# Patient Record
Sex: Female | Born: 1970
Health system: Southern US, Community
[De-identification: ages and names within clinical notes are randomized; demographics above are authoritative.]

## PROBLEM LIST (undated history)

## (undated) DIAGNOSIS — R000 Tachycardia, unspecified: Secondary | ICD-10-CM

## (undated) DIAGNOSIS — R002 Palpitations: Secondary | ICD-10-CM

## (undated) HISTORY — DX: Palpitations: R00.2

## (undated) HISTORY — PX: TONSILLECTOMY: SUR1361

## (undated) HISTORY — DX: Tachycardia, unspecified: R00.0

---

## 2017-09-22 DIAGNOSIS — Z0000 Encounter for general adult medical examination without abnormal findings: Secondary | ICD-10-CM | POA: Diagnosis not present

## 2017-09-22 DIAGNOSIS — R42 Dizziness and giddiness: Secondary | ICD-10-CM | POA: Diagnosis not present

## 2017-09-22 DIAGNOSIS — Z01419 Encounter for gynecological examination (general) (routine) without abnormal findings: Secondary | ICD-10-CM | POA: Diagnosis not present

## 2017-09-22 DIAGNOSIS — N920 Excessive and frequent menstruation with regular cycle: Secondary | ICD-10-CM | POA: Diagnosis not present

## 2017-09-24 DIAGNOSIS — Z1231 Encounter for screening mammogram for malignant neoplasm of breast: Secondary | ICD-10-CM | POA: Diagnosis not present

## 2017-10-01 DIAGNOSIS — R920 Mammographic microcalcification found on diagnostic imaging of breast: Secondary | ICD-10-CM | POA: Diagnosis not present

## 2017-10-01 DIAGNOSIS — R928 Other abnormal and inconclusive findings on diagnostic imaging of breast: Secondary | ICD-10-CM | POA: Diagnosis not present

## 2017-10-13 DIAGNOSIS — Z3202 Encounter for pregnancy test, result negative: Secondary | ICD-10-CM | POA: Diagnosis not present

## 2017-10-13 DIAGNOSIS — Z30433 Encounter for removal and reinsertion of intrauterine contraceptive device: Secondary | ICD-10-CM | POA: Diagnosis not present

## 2017-11-14 DIAGNOSIS — N938 Other specified abnormal uterine and vaginal bleeding: Secondary | ICD-10-CM | POA: Diagnosis not present

## 2017-11-14 DIAGNOSIS — Z30431 Encounter for routine checking of intrauterine contraceptive device: Secondary | ICD-10-CM | POA: Diagnosis not present

## 2018-04-06 DIAGNOSIS — R928 Other abnormal and inconclusive findings on diagnostic imaging of breast: Secondary | ICD-10-CM | POA: Diagnosis not present

## 2018-04-06 DIAGNOSIS — R921 Mammographic calcification found on diagnostic imaging of breast: Secondary | ICD-10-CM | POA: Diagnosis not present

## 2018-10-08 DIAGNOSIS — R921 Mammographic calcification found on diagnostic imaging of breast: Secondary | ICD-10-CM | POA: Diagnosis not present

## 2018-10-08 DIAGNOSIS — Z87898 Personal history of other specified conditions: Secondary | ICD-10-CM | POA: Diagnosis not present

## 2018-10-08 LAB — HM MAMMOGRAPHY

## 2018-12-01 DIAGNOSIS — M9903 Segmental and somatic dysfunction of lumbar region: Secondary | ICD-10-CM | POA: Diagnosis not present

## 2018-12-01 DIAGNOSIS — M5137 Other intervertebral disc degeneration, lumbosacral region: Secondary | ICD-10-CM | POA: Diagnosis not present

## 2018-12-01 DIAGNOSIS — M4316 Spondylolisthesis, lumbar region: Secondary | ICD-10-CM | POA: Diagnosis not present

## 2018-12-01 DIAGNOSIS — M5136 Other intervertebral disc degeneration, lumbar region: Secondary | ICD-10-CM | POA: Diagnosis not present

## 2018-12-03 DIAGNOSIS — M4316 Spondylolisthesis, lumbar region: Secondary | ICD-10-CM | POA: Diagnosis not present

## 2018-12-03 DIAGNOSIS — M9903 Segmental and somatic dysfunction of lumbar region: Secondary | ICD-10-CM | POA: Diagnosis not present

## 2018-12-03 DIAGNOSIS — M5136 Other intervertebral disc degeneration, lumbar region: Secondary | ICD-10-CM | POA: Diagnosis not present

## 2018-12-03 DIAGNOSIS — M5137 Other intervertebral disc degeneration, lumbosacral region: Secondary | ICD-10-CM | POA: Diagnosis not present

## 2018-12-10 DIAGNOSIS — M4316 Spondylolisthesis, lumbar region: Secondary | ICD-10-CM | POA: Diagnosis not present

## 2018-12-10 DIAGNOSIS — M5136 Other intervertebral disc degeneration, lumbar region: Secondary | ICD-10-CM | POA: Diagnosis not present

## 2018-12-10 DIAGNOSIS — M9903 Segmental and somatic dysfunction of lumbar region: Secondary | ICD-10-CM | POA: Diagnosis not present

## 2018-12-10 DIAGNOSIS — M5137 Other intervertebral disc degeneration, lumbosacral region: Secondary | ICD-10-CM | POA: Diagnosis not present

## 2018-12-16 DIAGNOSIS — M5137 Other intervertebral disc degeneration, lumbosacral region: Secondary | ICD-10-CM | POA: Diagnosis not present

## 2018-12-16 DIAGNOSIS — M9903 Segmental and somatic dysfunction of lumbar region: Secondary | ICD-10-CM | POA: Diagnosis not present

## 2018-12-16 DIAGNOSIS — M4316 Spondylolisthesis, lumbar region: Secondary | ICD-10-CM | POA: Diagnosis not present

## 2018-12-16 DIAGNOSIS — M5136 Other intervertebral disc degeneration, lumbar region: Secondary | ICD-10-CM | POA: Diagnosis not present

## 2018-12-22 DIAGNOSIS — M5137 Other intervertebral disc degeneration, lumbosacral region: Secondary | ICD-10-CM | POA: Diagnosis not present

## 2018-12-22 DIAGNOSIS — M9903 Segmental and somatic dysfunction of lumbar region: Secondary | ICD-10-CM | POA: Diagnosis not present

## 2018-12-22 DIAGNOSIS — M4316 Spondylolisthesis, lumbar region: Secondary | ICD-10-CM | POA: Diagnosis not present

## 2018-12-22 DIAGNOSIS — M5136 Other intervertebral disc degeneration, lumbar region: Secondary | ICD-10-CM | POA: Diagnosis not present

## 2018-12-24 DIAGNOSIS — M5137 Other intervertebral disc degeneration, lumbosacral region: Secondary | ICD-10-CM | POA: Diagnosis not present

## 2018-12-24 DIAGNOSIS — M5136 Other intervertebral disc degeneration, lumbar region: Secondary | ICD-10-CM | POA: Diagnosis not present

## 2018-12-24 DIAGNOSIS — M9903 Segmental and somatic dysfunction of lumbar region: Secondary | ICD-10-CM | POA: Diagnosis not present

## 2018-12-24 DIAGNOSIS — M4316 Spondylolisthesis, lumbar region: Secondary | ICD-10-CM | POA: Diagnosis not present

## 2018-12-29 DIAGNOSIS — M4316 Spondylolisthesis, lumbar region: Secondary | ICD-10-CM | POA: Diagnosis not present

## 2018-12-29 DIAGNOSIS — M5136 Other intervertebral disc degeneration, lumbar region: Secondary | ICD-10-CM | POA: Diagnosis not present

## 2018-12-29 DIAGNOSIS — M9903 Segmental and somatic dysfunction of lumbar region: Secondary | ICD-10-CM | POA: Diagnosis not present

## 2018-12-29 DIAGNOSIS — M5137 Other intervertebral disc degeneration, lumbosacral region: Secondary | ICD-10-CM | POA: Diagnosis not present

## 2018-12-30 DIAGNOSIS — M4316 Spondylolisthesis, lumbar region: Secondary | ICD-10-CM | POA: Diagnosis not present

## 2018-12-30 DIAGNOSIS — M5136 Other intervertebral disc degeneration, lumbar region: Secondary | ICD-10-CM | POA: Diagnosis not present

## 2018-12-30 DIAGNOSIS — M9903 Segmental and somatic dysfunction of lumbar region: Secondary | ICD-10-CM | POA: Diagnosis not present

## 2018-12-30 DIAGNOSIS — M5137 Other intervertebral disc degeneration, lumbosacral region: Secondary | ICD-10-CM | POA: Diagnosis not present

## 2019-01-01 ENCOUNTER — Telehealth: Payer: Self-pay | Admitting: General Practice

## 2019-01-01 NOTE — Telephone Encounter (Signed)
Patient calling in wanting to know if you will see her as a new patient. Her step sister is your patient, [name]. Please advise.

## 2019-01-01 NOTE — Telephone Encounter (Signed)
Ok to schedule.

## 2019-01-04 DIAGNOSIS — M5137 Other intervertebral disc degeneration, lumbosacral region: Secondary | ICD-10-CM | POA: Diagnosis not present

## 2019-01-04 DIAGNOSIS — M4316 Spondylolisthesis, lumbar region: Secondary | ICD-10-CM | POA: Diagnosis not present

## 2019-01-04 DIAGNOSIS — M9903 Segmental and somatic dysfunction of lumbar region: Secondary | ICD-10-CM | POA: Diagnosis not present

## 2019-01-04 DIAGNOSIS — M5136 Other intervertebral disc degeneration, lumbar region: Secondary | ICD-10-CM | POA: Diagnosis not present

## 2019-01-04 NOTE — Telephone Encounter (Signed)
Left patient a voicemail with information below. Let patient know to call us back to schedule an appointment in the office.

## 2019-01-06 DIAGNOSIS — M5136 Other intervertebral disc degeneration, lumbar region: Secondary | ICD-10-CM | POA: Diagnosis not present

## 2019-01-06 DIAGNOSIS — M5137 Other intervertebral disc degeneration, lumbosacral region: Secondary | ICD-10-CM | POA: Diagnosis not present

## 2019-01-06 DIAGNOSIS — M9903 Segmental and somatic dysfunction of lumbar region: Secondary | ICD-10-CM | POA: Diagnosis not present

## 2019-01-06 DIAGNOSIS — M4316 Spondylolisthesis, lumbar region: Secondary | ICD-10-CM | POA: Diagnosis not present

## 2019-01-11 DIAGNOSIS — M5137 Other intervertebral disc degeneration, lumbosacral region: Secondary | ICD-10-CM | POA: Diagnosis not present

## 2019-01-11 DIAGNOSIS — M9903 Segmental and somatic dysfunction of lumbar region: Secondary | ICD-10-CM | POA: Diagnosis not present

## 2019-01-11 DIAGNOSIS — M5136 Other intervertebral disc degeneration, lumbar region: Secondary | ICD-10-CM | POA: Diagnosis not present

## 2019-01-11 DIAGNOSIS — M4316 Spondylolisthesis, lumbar region: Secondary | ICD-10-CM | POA: Diagnosis not present

## 2019-01-19 DIAGNOSIS — M5137 Other intervertebral disc degeneration, lumbosacral region: Secondary | ICD-10-CM | POA: Diagnosis not present

## 2019-01-19 DIAGNOSIS — M5136 Other intervertebral disc degeneration, lumbar region: Secondary | ICD-10-CM | POA: Diagnosis not present

## 2019-01-19 DIAGNOSIS — M9903 Segmental and somatic dysfunction of lumbar region: Secondary | ICD-10-CM | POA: Diagnosis not present

## 2019-01-19 DIAGNOSIS — M4316 Spondylolisthesis, lumbar region: Secondary | ICD-10-CM | POA: Diagnosis not present

## 2019-01-25 DIAGNOSIS — M5136 Other intervertebral disc degeneration, lumbar region: Secondary | ICD-10-CM | POA: Diagnosis not present

## 2019-01-25 DIAGNOSIS — M4316 Spondylolisthesis, lumbar region: Secondary | ICD-10-CM | POA: Diagnosis not present

## 2019-01-25 DIAGNOSIS — M5137 Other intervertebral disc degeneration, lumbosacral region: Secondary | ICD-10-CM | POA: Diagnosis not present

## 2019-01-25 DIAGNOSIS — M9903 Segmental and somatic dysfunction of lumbar region: Secondary | ICD-10-CM | POA: Diagnosis not present

## 2019-02-01 ENCOUNTER — Encounter: Payer: Self-pay | Admitting: Family Medicine

## 2019-02-01 ENCOUNTER — Other Ambulatory Visit: Payer: Self-pay

## 2019-02-01 ENCOUNTER — Ambulatory Visit (INDEPENDENT_AMBULATORY_CARE_PROVIDER_SITE_OTHER): Payer: BC Managed Care – PPO | Admitting: Family Medicine

## 2019-02-01 VITALS — BP 121/80 | HR 73 | Ht 65.5 in | Wt 200.0 lb

## 2019-02-01 DIAGNOSIS — Z23 Encounter for immunization: Secondary | ICD-10-CM

## 2019-02-01 DIAGNOSIS — I459 Conduction disorder, unspecified: Secondary | ICD-10-CM | POA: Diagnosis not present

## 2019-02-01 DIAGNOSIS — N393 Stress incontinence (female) (male): Secondary | ICD-10-CM

## 2019-02-01 DIAGNOSIS — R002 Palpitations: Secondary | ICD-10-CM

## 2019-02-01 DIAGNOSIS — M41129 Adolescent idiopathic scoliosis, site unspecified: Secondary | ICD-10-CM

## 2019-02-01 DIAGNOSIS — E7800 Pure hypercholesterolemia, unspecified: Secondary | ICD-10-CM | POA: Diagnosis not present

## 2019-02-01 DIAGNOSIS — G8929 Other chronic pain: Secondary | ICD-10-CM

## 2019-02-01 DIAGNOSIS — M549 Dorsalgia, unspecified: Secondary | ICD-10-CM

## 2019-02-01 NOTE — Progress Notes (Signed)
New Patient Office Visit  Subjective:  Patient ID: Rachel Cisneros, female    DOB: 26-Feb-1971  Age: 48 y.o. MRN: [id number]  CC:  Chief Complaint  Patient presents with  . Establish Care  . Palpitations    HPI Rachel Cisneros presents to establish care.  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  She is the step sister of Rachel Cisneros a patient of mine.    She has been having some palpations that come on without any triggers.  MGM died age 32 sudden death, thinks had a heart issues.  Mother died 69 brain cancer.  Palpitations last a few min and then goes away.  He is actually been going on for a few years but she feels like they have become more frequent.  She says sometimes it feels like a skipped beat but sometimes she has episodes where she feels like her heart is racing she feels like they are different and do not occur at the same time.  Usually if it feels like her heart is racing it can last for up to 30 minutes.  She does sometimes get nauseated with it or feel like she is going to pass out and almost feels hot.  She has never actually passed out because of it.  She does have regular periods but has noticed that sometimes the symptoms occur about 3 to 5 days after her.  She does seem to have an occasional cough particularly in the morning usually associated with some drainage but does not bother her much during the day.  She does have a history of chronic back pain and sees a chiropractor periodically for that.  She does occasionally leak urine as she does have a history of stress incontinence.   History reviewed. No pertinent past medical history.  Past Surgical History:  Procedure Laterality Date  . TONSILLECTOMY      Family History  Problem Relation Age of Onset  . Brain cancer Mother 5  . Lung cancer Maternal Grandfather 22  . Sudden Cardiac Death Maternal Grandmother 49    Social History   Socioeconomic History  . Marital status: Married    Spouse name: Not  on file  . Number of children: Not on file  . Years of education: Not on file  . Highest education level: Not on file  Occupational History  . Not on file  Social Needs  . Financial resource strain: Not on file  . Food insecurity    Worry: Not on file    Inability: Not on file  . Transportation needs    Medical: Not on file    Non-medical: Not on file  Tobacco Use  . Smoking status: Former Smoker    Types: Cigarettes    Quit date: 04/15/2001    Years since quitting: 17.8  . Smokeless tobacco: Never Used  Substance and Sexual Activity  . Alcohol use: Yes    Alcohol/week: 2.0 standard drinks    Types: 2 Standard drinks or equivalent per week  . Drug use: Never  . Sexual activity: Yes    Partners: Male    Birth control/protection: I.U.D.  Lifestyle  . Physical activity    Days per week: Not on file    Minutes per session: Not on file  . Stress: Not on file  Relationships  . Social Herbalist on phone: Not on file    Gets together: Not on file    Attends religious  service: Not on file    Active member of club or organization: Not on file    Attends meetings of clubs or organizations: Not on file    Relationship status: Not on file  . Intimate partner violence    Fear of current or ex partner: Not on file    Emotionally abused: Not on file    Physically abused: Not on file    Forced sexual activity: Not on file  Other Topics Concern  . Not on file  Social History Narrative  . Not on file    ROS Review of Systems  Constitutional: Negative for diaphoresis, fever and unexpected weight change.  HENT: Negative for hearing loss, postnasal drip, sneezing and tinnitus.   Eyes: Negative for visual disturbance.  Respiratory: Positive for cough. Negative for wheezing.   Cardiovascular: Positive for palpitations. Negative for chest pain.  Genitourinary: Negative for vaginal bleeding and vaginal discharge.       Leaking urine  Musculoskeletal: Positive for back  pain. Negative for arthralgias.  Neurological: Negative for headaches.  Hematological: Negative for adenopathy. Does not bruise/bleed easily.    Objective:   Today's Vitals: BP 121/80   Pulse 73   Ht 5' 5.5" (1.664 m)   Wt 200 lb (90.7 kg)   LMP 01/19/2019 (Exact Date)   SpO2 100%   BMI 32.78 kg/m   Physical Exam Vitals signs reviewed.  Constitutional:      Appearance: She is well-developed.  HENT:     Head: Normocephalic and atraumatic.     Right Ear: External ear normal.     Left Ear: External ear normal.  Eyes:     Conjunctiva/sclera: Conjunctivae normal.  Neck:     Musculoskeletal: No muscular tenderness.     Vascular: No carotid bruit.  Cardiovascular:     Rate and Rhythm: Normal rate and regular rhythm.     Heart sounds: Normal heart sounds.     Comments: Radia. Pulse 2+ bilaterally.  Pulmonary:     Effort: Pulmonary effort is normal.     Breath sounds: Normal breath sounds.  Musculoskeletal:     Right lower leg: No edema.     Left lower leg: No edema.  Lymphadenopathy:     Cervical: No cervical adenopathy.  Skin:    General: Skin is warm and dry.     Coloration: Skin is not pale.  Neurological:     Mental Status: She is alert and oriented to person, place, and time.  Psychiatric:        Behavior: Behavior normal.     Assessment & Plan:   Problem List Items Addressed This Visit      Musculoskeletal and Integument   Scoliosis    No active treatment.         Other   Stress incontinence   Palpitation - Primary    EKG today shows NSR with low voltage in the lateral lead and Left ant fasc block. No prior for comparison. Schedule for 2 week heart monitor.  Check for anemia and thyroid d/o.      Relevant Orders   ECHOCARDIOGRAM COMPLETE   LONG TERM MONITOR-LIVE TELEMETRY (3-14 DAYS)   CBC (Completed)   COMPLETE METABOLIC PANEL WITH GFR (Completed)   Lipid panel (Completed)   TSH (Completed)   Ferritin (Completed)   EKG 12-Lead (Completed)    Chronic back pain   Relevant Medications   cyclobenzaprine (FLEXERIL) 10 MG tablet    Other Visit Diagnoses    Skipped  heart beats       Relevant Orders   ECHOCARDIOGRAM COMPLETE   LONG TERM MONITOR-LIVE TELEMETRY (3-14 DAYS)   CBC (Completed)   COMPLETE METABOLIC PANEL WITH GFR (Completed)   Lipid panel (Completed)   TSH (Completed)   Ferritin (Completed)   Need for immunization against influenza       Relevant Orders   Flu Vaccine QUAD 36+ mos IM (Completed)       Outpatient Encounter Medications as of 02/01/2019  Medication Sig  . cyclobenzaprine (FLEXERIL) 10 MG tablet Take 1 tablet by mouth at bedtime as needed.  . valACYclovir (VALTREX) 500 MG tablet Take 1 tablet by mouth 2 (two) times daily as needed.   No facility-administered encounter medications on file as of 02/01/2019.     Follow-up: Return in about 4 weeks (around 03/01/2019) for palpitations.   Beatrice Lecher, MD

## 2019-02-02 DIAGNOSIS — N393 Stress incontinence (female) (male): Secondary | ICD-10-CM

## 2019-02-02 DIAGNOSIS — G8929 Other chronic pain: Secondary | ICD-10-CM | POA: Insufficient documentation

## 2019-02-02 DIAGNOSIS — M549 Dorsalgia, unspecified: Secondary | ICD-10-CM

## 2019-02-02 DIAGNOSIS — R002 Palpitations: Secondary | ICD-10-CM | POA: Insufficient documentation

## 2019-02-02 DIAGNOSIS — M419 Scoliosis, unspecified: Secondary | ICD-10-CM

## 2019-02-02 HISTORY — DX: Other chronic pain: G89.29

## 2019-02-02 HISTORY — DX: Palpitations: R00.2

## 2019-02-02 HISTORY — DX: Dorsalgia, unspecified: M54.9

## 2019-02-02 HISTORY — DX: Scoliosis, unspecified: M41.9

## 2019-02-02 HISTORY — DX: Stress incontinence (female) (male): N39.3

## 2019-02-02 LAB — LIPID PANEL
Cholesterol: 204 mg/dL — ABNORMAL HIGH (ref <200)
HDL: 63 mg/dL (ref >=50)
LDL Cholesterol (Calc): 126 mg/dL (calc) — ABNORMAL HIGH
Non-HDL Cholesterol (Calc): 141 mg/dL (calc) — ABNORMAL HIGH (ref <130)
Total CHOL/HDL Ratio: 3.2 (calc) (ref <5.0)
Triglycerides: 63 mg/dL (ref <150)

## 2019-02-02 LAB — CBC
HCT: 44.5 % (ref 35.0–45.0)
Hemoglobin: 14.8 g/dL (ref 11.7–15.5)
MCH: 30.2 pg (ref 27.0–33.0)
MCHC: 33.3 g/dL (ref 32.0–36.0)
MCV: 90.8 fL (ref 80.0–100.0)
MPV: 9.7 fL (ref 7.5–12.5)
Platelets: 318 10*3/uL (ref 140–400)
RBC: 4.9 10*6/uL (ref 3.80–5.10)
RDW: 12.2 % (ref 11.0–15.0)
WBC: 4.6 10*3/uL (ref 3.8–10.8)

## 2019-02-02 LAB — COMPLETE METABOLIC PANEL WITH GFR
AG Ratio: 2 (calc) (ref 1.0–2.5)
ALT: 8 U/L (ref 6–29)
AST: 12 U/L (ref 10–35)
Albumin: 4.3 g/dL (ref 3.6–5.1)
Alkaline phosphatase (APISO): 36 U/L (ref 31–125)
BUN: 11 mg/dL (ref 7–25)
CO2: 27 mmol/L (ref 20–32)
Calcium: 9.1 mg/dL (ref 8.6–10.2)
Chloride: 105 mmol/L (ref 98–110)
Creat: 0.7 mg/dL (ref 0.50–1.10)
GFR, Est African American: 120 mL/min/{1.73_m2} (ref >=60)
GFR, Est Non African American: 104 mL/min/{1.73_m2} (ref >=60)
Globulin: 2.1 g/dL (calc) (ref 1.9–3.7)
Glucose, Bld: 93 mg/dL (ref 65–99)
Potassium: 4.3 mmol/L (ref 3.5–5.3)
Sodium: 140 mmol/L (ref 135–146)
Total Bilirubin: 0.6 mg/dL (ref 0.2–1.2)
Total Protein: 6.4 g/dL (ref 6.1–8.1)

## 2019-02-02 LAB — FERRITIN: Ferritin: 65 ng/mL (ref 16–232)

## 2019-02-02 LAB — TSH: TSH: 0.89 mIU/L

## 2019-02-08 NOTE — Assessment & Plan Note (Addendum)
EKG today shows NSR with low voltage in the lateral lead and Left ant fasc block. No prior for comparison. Schedule for 2 week heart monitor.  Check for anemia and thyroid d/o.

## 2019-02-08 NOTE — Assessment & Plan Note (Signed)
No active treatment.

## 2019-02-10 ENCOUNTER — Encounter: Payer: Self-pay | Admitting: Family Medicine

## 2019-02-10 DIAGNOSIS — I459 Conduction disorder, unspecified: Secondary | ICD-10-CM

## 2019-02-10 HISTORY — DX: Conduction disorder, unspecified: I45.9

## 2019-02-15 ENCOUNTER — Telehealth: Payer: Self-pay | Admitting: Family Medicine

## 2019-02-15 NOTE — Telephone Encounter (Signed)
Dr Marquis Buggy called today about her heat monitor and Echo order that you placed on 02/01/2019. I did not have these orders and were not aware they were even placed until patient called. I gave the Echo to [facility] for authorization and I faxed the monitor to [facility]. I also called [facility] and they are going to call the patient today to get her scheduled for her monitor.    Jenny Reichmann

## 2019-02-19 ENCOUNTER — Ambulatory Visit (INDEPENDENT_AMBULATORY_CARE_PROVIDER_SITE_OTHER): Payer: BC Managed Care – PPO

## 2019-02-19 ENCOUNTER — Other Ambulatory Visit: Payer: Self-pay

## 2019-02-19 ENCOUNTER — Encounter: Payer: Self-pay | Admitting: Family

## 2019-02-19 ENCOUNTER — Ambulatory Visit (INDEPENDENT_AMBULATORY_CARE_PROVIDER_SITE_OTHER): Payer: BC Managed Care – PPO | Admitting: Family

## 2019-02-19 ENCOUNTER — Ambulatory Visit (HOSPITAL_BASED_OUTPATIENT_CLINIC_OR_DEPARTMENT_OTHER)
Admission: RE | Admit: 2019-02-19 | Discharge: 2019-02-19 | Disposition: A | Discharge status: Home / Self Care | Payer: BC Managed Care – PPO | Source: Ambulatory Visit | Attending: Family Medicine | Admitting: Family Medicine

## 2019-02-19 VITALS — BP 118/60 | HR 67 | Resp 18 | Ht 65.5 in | Wt 198.5 lb

## 2019-02-19 DIAGNOSIS — E785 Hyperlipidemia, unspecified: Secondary | ICD-10-CM | POA: Diagnosis not present

## 2019-02-19 DIAGNOSIS — R002 Palpitations: Secondary | ICD-10-CM

## 2019-02-19 DIAGNOSIS — I459 Conduction disorder, unspecified: Secondary | ICD-10-CM | POA: Insufficient documentation

## 2019-02-19 DIAGNOSIS — Z8241 Family history of sudden cardiac death: Secondary | ICD-10-CM | POA: Diagnosis not present

## 2019-02-19 DIAGNOSIS — R000 Tachycardia, unspecified: Secondary | ICD-10-CM

## 2019-02-19 NOTE — Patient Instructions (Signed)
Medication Instructions:  No medication changes today.   *If you need a refill on your cardiac medications before your next appointment, please call your pharmacy*  Lab Work: No lab work Catering manager.  If you have labs (blood work) drawn today and your tests are completely normal, you will receive your results only by: Marland Kitchen MyChart Message (if you have MyChart) OR . A paper copy in the mail If you have any lab test that is abnormal or we need to change your treatment, we will call you to review the results.  Testing/Procedures: You had a ZIO monitor placed today. Please wear for 2 weeks then return in the mail.  You had an echocardiogram today. Expect results early next week.   Follow-Up: At [facility], you and your health needs are our priority.  As part of our continuing mission to provide you with exceptional heart care, we have created designated Provider Care Teams.  These Care Teams include your primary Cardiologist (physician) and Advanced Practice Providers (APPs -  Physician Assistants and Nurse Practitioners) who all work together to provide you with the care you need, when you need it.  Your next appointment:   5 weeks  The format for your next appointment:   In Person  Provider:   Berniece Salines, DO  Other Instructions  Medications and Palpitations 1. Avoid all over-the-counter antihistamines except Claritin/Loratadine and Zyrtec/Cetrizine. 2. Avoid all combination including cold sinus allergies flu decongestant and sleep medications 3. You can use Robitussin DM Mucinex and Mucinex DM for cough. 4. can use Tylenol aspirin ibuprofen and naproxen but no combinations such as sleep or sinus.   Palpitations Palpitations are feelings that your heartbeat is irregular or is faster than normal. It may feel like your heart is fluttering or skipping a beat. Palpitations are usually not a serious problem. They may be caused by many things, including smoking, caffeine, alcohol, stress,  and certain medicines or drugs. Most causes of palpitations are not serious. However, some palpitations can be a sign of a serious problem. You may need further tests to rule out serious medical problems. Follow these instructions at home:     Pay attention to any changes in your condition. Take these actions to help manage your symptoms: Eating and drinking  Avoid foods and drinks that may cause palpitations. These may include: ? Caffeinated coffee, tea, soft drinks, diet pills, and energy drinks. ? Chocolate. ? Alcohol. Lifestyle  Take steps to reduce your stress and anxiety. Things that can help you relax include: ? Yoga. ? Mind-body activities, such as deep breathing, meditation, or using words and images to create positive thoughts (guided imagery). ? Physical activity, such as swimming, jogging, or walking. Tell your health care provider if your palpitations increase with activity. If you have chest pain or shortness of breath with activity, do not continue the activity until you are seen by your health care provider. ? Biofeedback. This is a method that helps you learn to use your mind to control things in your body, such as your heartbeat.  Do not use drugs, including cocaine or ecstasy. Do not use marijuana.  Get plenty of rest and sleep. Keep a regular bed time. General instructions  Take over-the-counter and prescription medicines only as told by your health care provider.  Do not use any products that contain nicotine or tobacco, such as cigarettes and e-cigarettes. If you need help quitting, ask your health care provider.  Keep all follow-up visits as told by your health  care provider. This is important. These may include visits for further testing if palpitations do not go away or get worse. Contact a health care provider if you:  Continue to have a fast or irregular heartbeat after 24 hours.  Notice that your palpitations occur more often. Get help right away if you:   Have chest pain or shortness of breath.  Have a severe headache.  Feel dizzy or you faint. Summary  Palpitations are feelings that your heartbeat is irregular or is faster than normal. It may feel like your heart is fluttering or skipping a beat.  Palpitations may be caused by many things, including smoking, caffeine, alcohol, stress, certain medicines, and drugs.  Although most causes of palpitations are not serious, some causes can be a sign of a serious medical problem.  Get help right away if you faint or have chest pain, shortness of breath, a severe headache, or dizziness. This information is not intended to replace advice given to you by your health care provider. Make sure you discuss any questions you have with your health care provider. Document Released: 03/29/2000 Document Revised: 05/14/2017 Document Reviewed: 05/14/2017 Elsevier Patient Education  2020 [employer].

## 2019-02-19 NOTE — Progress Notes (Signed)
Cardiology Clinic Note   Patient Name: Rachel Cisneros Date of Encounter: 02/19/2019  Primary Care Provider:  Hali Marry, MD Primary Cardiologist:  No primary care provider on file. Electrophysiologist:  None   Patient Profile    Rachel Cisneros is a 48 y.o. female with a hx of palpitations, chronic back pain, stress incontinence. Presents today for palpitation and ZIO.   Past Medical History    Past Medical History:  Diagnosis Date  . Palpitations   . Tachycardia    Past Surgical History:  Procedure Laterality Date  . TONSILLECTOMY      Allergies  Allergies  Allergen Reactions  . Codeine Shortness Of Breath and Swelling    History of Present Illness   Rachel Cisneros is a 48 y.o. female with a hx of palpitations, chronic back pain, stress incontinence. Presents today for palpitations and placement of ZIO monitor at request of Dr. Madilyn Fireman. Recently established primary care with Dr. Madilyn Fireman, tells me she has not had a PCP previously.   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 Seen by Dr. Madilyn Fireman 02/01/19 for palpitations. Echocardiogram and ZIO were ordered at that visit. Labs were done including normal potassium, kidney function, liver function, CBC. Her lipi profile showed a total cholesterol 204, LDL 126, triglycerides 63.  She does not smoke. Tells me she drinks infrequently, typically 1-2 standard drinks. She takes no prescribed pro-arrhthymic medications. She takes no OTC proarrhthymic medications. She drinks 2 twelve ounce coffees in the morning and an occasional Coke Zero - her caffeine intake has not changed recently.  Her maternal grandmother had a sudden cardiac death. Tells me she thinks she also had some arrthymia. Her mother died of brain cancer at 39 years old.   Tells me she has had palpitations for many years. They previously were not bothersome. Tells me now she is having "weird" feelings. She notes 'waves' where she feels her heart stop and  then resume. Has felt this up into her neck. Has noted tachycardia at rest with heart rate sup to 120 bpm.   She has recently started walking and running for exercise. Endorses that she has been a bit slack in her diet recently.   An echocardiogram was ordered by Dr. Suzi Roots and completed today prior to our visit.   EKGs/Labs/Other Studies Reviewed:   The following studies were reviewed today:  EKG 02/01/19 Independently reviewed shows SR with LAFB and no acute ST/T wave changes.  Recent Labs: 02/01/2019: ALT 8; BUN 11; Creat 0.70; Hemoglobin 14.8; Platelets 318; Potassium 4.3; Sodium 140; TSH 0.89  Recent Lipid Panel    Component Value Date/Time   CHOL 204 (H) 02/01/2019 1034   TRIG 63 02/01/2019 1034   HDL 63 02/01/2019 1034   CHOLHDL 3.2 02/01/2019 1034   LDLCALC 126 (H) 02/01/2019 1034    Home Medications      Current Meds  Medication Sig  . cyclobenzaprine (FLEXERIL) 10 MG tablet Take 1 tablet by mouth at bedtime as needed.  . valACYclovir (VALTREX) 500 MG tablet Take 1 tablet by mouth 2 (two) times daily as needed.     Family History    Family History  Problem Relation Age of Onset  . Brain cancer Mother 9  . Lung cancer Maternal Grandfather 69  . Sudden Cardiac Death Maternal Grandmother 50   She indicated that her mother is deceased. She indicated that the status of her maternal grandmother is unknown. She indicated that the status of her  maternal grandfather is unknown.  Social History    Social History   Socioeconomic History  . Marital status: Married    Spouse name: Not on file  . Number of children: Not on file  . Years of education: Not on file  . Highest education level: Not on file  Occupational History  . Not on file  Social Needs  . Financial resource strain: Not on file  . Food insecurity    Worry: Not on file    Inability: Not on file  . Transportation needs    Medical: Not on file    Non-medical: Not on file  Tobacco Use  . Smoking  status: Former Smoker    Types: Cigarettes    Quit date: 04/15/2001    Years since quitting: 17.8  . Smokeless tobacco: Never Used  Substance and Sexual Activity  . Alcohol use: Yes    Alcohol/week: 2.0 standard drinks    Types: 2 Standard drinks or equivalent per week  . Drug use: Never  . Sexual activity: Yes    Partners: Male    Birth control/protection: I.U.D.  Lifestyle  . Physical activity    Days per week: Not on file    Minutes per session: Not on file  . Stress: Not on file  Relationships  . Social Herbalist on phone: Not on file    Gets together: Not on file    Attends religious service: Not on file    Active member of club or organization: Not on file    Attends meetings of clubs or organizations: Not on file    Relationship status: Not on file  . Intimate partner violence    Fear of current or ex partner: Not on file    Emotionally abused: Not on file    Physically abused: Not on file    Forced sexual activity: Not on file  Other Topics Concern  . Not on file  Social History Narrative  . Not on file     Review of Systems    ROS  Physical Exam    VS:  BP 118/60 (BP Location: Left Arm, Patient Position: Sitting, Cuff Size: Normal)   Pulse 67   Resp 18   Ht 5' 5.5" (1.664 m)   Wt 198 lb 8 oz (90 kg)   SpO2 97%   BMI 32.53 kg/m  , BMI Body mass index is 32.53 kg/m. GEN: Well nourished, well developed, in no acute distress. HEENT: Normal. Neck: Supple, no JVD, carotid bruits, or masses. Cardiac: RRR, no murmurs, rubs, or gallops. No clubbing, cyanosis, edema.  Radials/DP/PT 2+ and equal bilaterally.  Respiratory:  Respirations regular and unlabored, clear to auscultation bilaterally. GI: Soft, nontender, nondistended, BS + x 4. MS: No deformity or atrophy. Skin: Warm and dry, no rash. Neuro:  Strength and sensation are intact. Psych: Normal affect.  Assessment & Plan   1. Palpitations - Present for many years. Recently worsened with  sensation of 'wave' where her heart stops then forcefully restarts. Not associated with SOB nor diaphoresis. Labs 02/01/19 normal electrolyte, TSH.  Risk factors: does not smoke, occasional etoh use, 2 twelve oz cups coffee per day, no proarrthymic medications. Etiology PVC vs SVT. List of proarrthymic medications to avoid provided.   Recommend monitor caffeine, etoh intake. Recommend stress management as stress can trigger palpitations.   Echo done today.   14 day ZIO placed today  2. Tachycardia - HR as high as 120 when checked  at home while resting. ZIO monitor and lifestyle recommendations, as above.   3. DLD - Lipid profile 02/01/19 total cholesterol 204, HDL 63, LDl 126, triglycerides 63. Discussed lipid lowering diet including healthy fats (avocado, olive oil), lean proteins, and whole grains. Recommend continued regular cardiovascular exercise.   4. Family of sudden cardiac death - Maternal grandmother with sudden cardiac death at 46 years old. Echo done today per Dr. Morrie Sheldon order.   Disposition: in 5 week(s) with Dr. Harriet Masson.  Loel Dubonnet, NP  02/19/2019, 8:11 PM

## 2019-02-19 NOTE — Progress Notes (Signed)
  Echocardiogram 2D Echocardiogram has been performed.  Rachel Cisneros 02/19/2019, 10:53 AM

## 2019-02-26 ENCOUNTER — Ambulatory Visit: Payer: BC Managed Care – PPO | Admitting: Family Medicine

## 2019-03-13 DIAGNOSIS — R002 Palpitations: Secondary | ICD-10-CM | POA: Diagnosis not present

## 2019-03-17 ENCOUNTER — Telehealth: Payer: Self-pay | Admitting: Family

## 2019-03-17 NOTE — Telephone Encounter (Signed)
Called to review ZIO results. Left voicemail for her to call back to discuss.   ZIO monitor shows short bursts of non sustained VT that were associated with triggered events. Would like to discuss starting Toprol XL 76m daily with her. Then she can discuss how the Toprol XL is relieving symptoms at her upcoming follow up with Dr. THarriet Masson   CLoel Dubonnet NP

## 2019-03-17 NOTE — Telephone Encounter (Signed)
Reviewed results with patient via telephone.   ZIO XT with 2 short bursts of VT. Infrequent PVC/PAC <1%. Majority of her triggered episodes were SR or ST. She did trigger the short bursts of VT.  Discussed that short bursts of VT were without clear etiology as her recent echo was without significant findings.   Discussed possibility of adding Toprol XL 77m. She is reassured that she is not having frequent episodes. She requests to monitor her symptoms over the next week and talk with Dr. THarriet Massonat follow up. Agreeable with this plan.   We discussed that a beta blocker can be used to prevent episodes to prevent symptoms but is not required with her infrequent episodes at this time. Also discussed we could consider "pill in the pocket" method for if she had increased episodes of palpitations.   CLoel Dubonnet NP

## 2019-03-19 ENCOUNTER — Ambulatory Visit: Payer: BC Managed Care – PPO | Admitting: Family Medicine

## 2019-03-26 ENCOUNTER — Ambulatory Visit (INDEPENDENT_AMBULATORY_CARE_PROVIDER_SITE_OTHER): Payer: BC Managed Care – PPO | Admitting: Cardiology

## 2019-03-26 ENCOUNTER — Encounter: Payer: Self-pay | Admitting: Cardiology

## 2019-03-26 ENCOUNTER — Other Ambulatory Visit: Payer: Self-pay

## 2019-03-26 VITALS — BP 116/74 | HR 85 | Ht 65.5 in | Wt 202.0 lb

## 2019-03-26 DIAGNOSIS — E785 Hyperlipidemia, unspecified: Secondary | ICD-10-CM

## 2019-03-26 DIAGNOSIS — R079 Chest pain, unspecified: Secondary | ICD-10-CM

## 2019-03-26 DIAGNOSIS — I472 Ventricular tachycardia: Secondary | ICD-10-CM | POA: Diagnosis not present

## 2019-03-26 DIAGNOSIS — I4729 Other ventricular tachycardia: Secondary | ICD-10-CM

## 2019-03-26 MED ORDER — METOPROLOL SUCCINATE 25 MG PO CS24: 12.5000 mg | EXTENDED_RELEASE_CAPSULE | Freq: Every day | ORAL | 2 refills | Status: DC

## 2019-03-26 NOTE — Progress Notes (Signed)
Cardiology Office Note:    Date:  03/26/2019   ID:  [name], DOB 11/20/1970, MRN [id number]  PCP:  Hali Marry, MD  Cardiologist:  Berniece Salines, DO  Electrophysiologist:  None   Referring MD: Hali Marry, *   Chief Complaint  Patient presents with  . Follow-up    monitor    History of Present Illness:    Rachel Cisneros is a 48 y.o. female with a hx of chronic back pain, stress intolerance presented initially on 02/19/2019 to be evaluated for palpations.   At that time a zio monitor was placed on the patient. She is her today for a follow up visit.   She tells me that she has been experiencing mid sternum chest pain. She describes it as a squeezing sensation with no radiation. She notes that this last for seconds prior to resolution. She also admits to shortness of breath on exertion.  In addition she is still experiencing palpitations.  Past Medical History:  Diagnosis Date  . Palpitations   . Tachycardia     Past Surgical History:  Procedure Laterality Date  . TONSILLECTOMY      Current Medications: Current Meds  Medication Sig  . cyclobenzaprine (FLEXERIL) 10 MG tablet Take 1 tablet by mouth at bedtime as needed.  . valACYclovir (VALTREX) 500 MG tablet Take 1 tablet by mouth 2 (two) times daily as needed.     Allergies:   Codeine   Social History   Socioeconomic History  . Marital status: Married    Spouse name: Not on file  . Number of children: Not on file  . Years of education: Not on file  . Highest education level: Not on file  Occupational History  . Not on file  Tobacco Use  . Smoking status: Former Smoker    Types: Cigarettes    Quit date: 04/15/2001    Years since quitting: 17.9  . Smokeless tobacco: Never Used  Substance and Sexual Activity  . Alcohol use: Yes    Alcohol/week: 2.0 standard drinks    Types: 2 Standard drinks or equivalent per week  . Drug use: Never  . Sexual activity: Yes    Partners: Male   Birth control/protection: I.U.D.  Other Topics Concern  . Not on file  Social History Narrative  . Not on file   Social Determinants of Health   Financial Resource Strain:   . Difficulty of Paying Living Expenses: Not on file  Food Insecurity:   . Worried About Charity fundraiser in the Last Year: Not on file  . Ran Out of Food in the Last Year: Not on file  Transportation Needs:   . Lack of Transportation (Medical): Not on file  . Lack of Transportation (Non-Medical): Not on file  Physical Activity:   . Days of Exercise per Week: Not on file  . Minutes of Exercise per Session: Not on file  Stress:   . Feeling of Stress : Not on file  Social Connections:   . Frequency of Communication with Friends and Family: Not on file  . Frequency of Social Gatherings with Friends and Family: Not on file  . Attends Religious Services: Not on file  . Active Member of Clubs or Organizations: Not on file  . Attends Archivist Meetings: Not on file  . Marital Status: Not on file     Family History: The patient's family history includes Brain cancer (age of onset: 60) in her mother;  Lung cancer (age of onset: 89) in her maternal grandfather; Sudden Cardiac Death (age of onset: 27) in her maternal grandmother.  ROS:   Review of Systems  Constitution: Negative for decreased appetite, fever and weight gain.  HENT: Negative for congestion, ear discharge, hoarse voice and sore throat.   Eyes: Negative for discharge, redness, vision loss in right eye and visual halos.  Cardiovascular: Reports chest pain and shortness of breath. Negative for leg swelling, orthopnea.  Respiratory: Negative for cough, hemoptysis, shortness of breath and snoring.   Endocrine: Negative for heat intolerance and polyphagia.  Hematologic/Lymphatic: Negative for bleeding problem. Does not bruise/bleed easily.  Skin: Negative for flushing, nail changes, rash and suspicious lesions.  Musculoskeletal: Negative for  arthritis, joint pain, muscle cramps, myalgias, neck pain and stiffness.  Gastrointestinal: Negative for abdominal pain, bowel incontinence, diarrhea and excessive appetite.  Genitourinary: Negative for decreased libido, genital sores and incomplete emptying.  Neurological: Negative for brief paralysis, focal weakness, headaches and loss of balance.  Psychiatric/Behavioral: Negative for altered mental status, depression and suicidal ideas.  Allergic/Immunologic: Negative for HIV exposure and persistent infections.    EKGs/Labs/Other Studies Reviewed:    The following studies were reviewed today:   EKG: None today  ZIO monitor: Baseline rhythm: Sinus  Minimum heart rate: 52 BPM.  Average heart rate: 83 BPM.  Maximal heart rate 226 BPM.  Atrial arrhythmia: Infrequent PACs  Ventricular arrhythmia: 2 runs of ventricular tachycardia : 4 beats at rate of 226 symptomatic, second  6 beats at rate of 132 bpm, also ventricle ectopy noted.  Conduction abnormality: None  Symptoms: Multiple trigger events majority of those showing only sinus rhythm.  However ventricular tachycardia was also felt as fluttering.   Conclusion:  Symptomatic ventricular tachycardia, nonsustained  Recent Labs: 02/01/2019: ALT 8; BUN 11; Creat 0.70; Hemoglobin 14.8; Platelets 318; Potassium 4.3; Sodium 140; TSH 0.89  Recent Lipid Panel    Component Value Date/Time   CHOL 204 (H) 02/01/2019 1034   TRIG 63 02/01/2019 1034   HDL 63 02/01/2019 1034   CHOLHDL 3.2 02/01/2019 1034   LDLCALC 126 (H) 02/01/2019 1034    Physical Exam:    VS:  BP 116/74 (BP Location: Left Arm, Patient Position: Sitting, Cuff Size: Normal)   Pulse 85   Ht 5' 5.5" (1.664 m)   Wt 202 lb (91.6 kg)   SpO2 98%   BMI 33.10 kg/m     Wt Readings from Last 3 Encounters:  03/26/19 202 lb (91.6 kg)  02/19/19 198 lb 8 oz (90 kg)  02/01/19 200 lb (90.7 kg)     GEN: Well nourished, well developed in no acute distress HEENT:  Normal NECK: No JVD; No carotid bruits LYMPHATICS: No lymphadenopathy CARDIAC: S1S2 noted,RRR, no murmurs, rubs, gallops RESPIRATORY:  Clear to auscultation without rales, wheezing or rhonchi  ABDOMEN: Soft, non-tender, non-distended, +bowel sounds, no guarding. EXTREMITIES: No edema, No cyanosis, no clubbing MUSCULOSKELETAL:  No edema; No deformity  SKIN: Warm and dry NEUROLOGIC:  Alert and oriented x 3, non-focal PSYCHIATRIC:  Normal affect, good insight  ASSESSMENT:    1. Chest pain of uncertain etiology   2. Nonsustained ventricular tachycardia ([place])   3. Dyslipidemia    PLAN:    She still experiencing symptoms with palpitations.  In addition her monitor did show evidence of nonsustained ventricular tachycardia.  At this time I will like to start the patient on Toprol-XL 12.5 mg daily.  She is experiencing chest pain and with the nonsustained ventricular  tachycardia I want to make sure coronary disease is not a contributing factor therefore I have scheduled the patient for getting a coronary artery CTA.  She does not have any contrast dye allergy.  She is agreeable to proceed with this study.  During our discussion I was able to understand that there may be a genetic contributing factor to her NSVT as she tells me a young cousin of hers did have ICD implantation as a result of ventricular tachycardia.  We will also need to get genetic studies and an MRI may also be warranted.  Hyperlipidemia - her recent LCL is 126. Lifestyle modification. We will repeat this in 3 months. If this does not improve she will benefit from statin therapy.  The patient is in agreement with the above plan. The patient left the office in stable condition.  The patient will follow up in 3 months.   Medication Adjustments/Labs and Tests Ordered: Current medicines are reviewed at length with the patient today.  Concerns regarding medicines are outlined above.  Orders Placed This Encounter  Procedures  . CT  CORONARY MORPH W/CTA COR W/SCORE W/CA W/CM &/OR WO/CM  . CT CORONARY FRACTIONAL FLOW RESERVE DATA PREP  . CT CORONARY FRACTIONAL FLOW RESERVE FLUID ANALYSIS  . Basic Metabolic Panel (BMET)  . Magnesium   Meds ordered this encounter  Medications  . Metoprolol Succinate 25 MG CS24    Sig: Take 12.5 mg by mouth daily. 1/2 tablet    Dispense:  30 capsule    Refill:  2    Patient Instructions  Medication Instructions:  Your physician has recommended you make the following change in your medication:   START METOPROLOL SUCCINATE 12.5 MG ONCE DAILY    *If you need a refill on your cardiac medications before your next appointment, please call your pharmacy*  Lab Work: BMET Magnesium   If you have labs (blood work) drawn today and your tests are completely normal, you will receive your results only by: Rachel Cisneros (if you have MyChart) OR . A paper copy in the mail If you have any lab test that is abnormal or we need to change your treatment, we will call you to review the results.  Testing/Procedures: Your physician has requested that you have cardiac CT. Cardiac computed tomography (CT) is a painless test that uses an x-ray machine to take clear, detailed pictures of your heart. For further information please visit HugeFiesta.tn. Please follow instruction sheet as given.     Follow-Up: At [facility], you and your health needs are our priority.  As part of our continuing mission to provide you with exceptional heart care, we have created designated Provider Care Teams.  These Care Teams include your primary Cardiologist (physician) and Advanced Practice Providers (APPs -  Physician Assistants and Nurse Practitioners) who all work together to provide you with the care you need, when you need it.  Your next appointment:   3 month(s)  The format for your next appointment:   In Person  Provider:   Berniece Salines, DO  Other Instructions  Your cardiac CT will be  scheduled at one of the below locations:   [facility] [address] [place], [place] [address] [phone]  Please arrive at the [facility] main entrance of [facility] 30-45 minutes prior to test start time. Proceed to the [facility] Radiology Department (first floor) to check-in and test prep.  On the Night Before the Test: . Be sure  to Drink plenty of water. . Do not consume any caffeinated/decaffeinated beverages or chocolate 12 hours prior to your test. . Do not take any antihistamines 12 hours prior to your test.  On the Day of the Test: . Drink plenty of water. Do not drink any water within one hour of the test. . Do not eat any food 4 hours prior to the test. . You may take your regular medications prior to the test.  . Take metoprolol (Lopressor) two hours prior to test.12.5 mg . FEMALES- please wear underwire-free bra if available       After the Test: . Drink plenty of water. . After receiving IV contrast, you may experience a mild flushed feeling. This is normal. . On occasion, you may experience a mild rash up to 24 hours after the test. This is not dangerous. If this occurs, you can take Benadryl 25 mg and increase your fluid intake. . If you experience trouble breathing, this can be serious. If it is severe call 911 IMMEDIATELY. If it is mild, please call our office.  Once we have confirmed authorization from your insurance company, we will call you to set up a date and time for your test.   For non-scheduling related questions, please contact the cardiac imaging nurse navigator should you have any questions/concerns: Marchia Bond, RN Navigator Cardiac Imaging [facility] [phone] Office        Adopting a Healthy Lifestyle.  Know what a healthy weight is for you (roughly BMI <25) and aim to maintain this   Aim for 7+ servings of fruits and vegetables daily   65-80+ fluid ounces of water or unsweet  tea for healthy kidneys   Limit to max 1 drink of alcohol per day; avoid smoking/tobacco   Limit animal fats in diet for cholesterol and heart health - choose grass fed whenever available   Avoid highly processed foods, and foods high in saturated/trans fats   Aim for low stress - take time to unwind and care for your mental health   Aim for 150 min of moderate intensity exercise weekly for heart health, and weights twice weekly for bone health   Aim for 7-9 hours of sleep daily   When it comes to diets, agreement about the perfect plan isnt easy to find, even among the experts. Experts at the [employer] developed an idea known as the Healthy Eating Plate. Just imagine a plate divided into logical, healthy portions.   The emphasis is on diet quality:   Load up on vegetables and fruits - one-half of your plate: Aim for color and variety, and remember that potatoes dont count.   Go for whole grains - one-quarter of your plate: Whole wheat, barley, wheat berries, quinoa, oats, brown rice, and foods made with them. If you want pasta, go with whole wheat pasta.   Protein power - one-quarter of your plate: Fish, chicken, beans, and nuts are all healthy, versatile protein sources. Limit red meat.   The diet, however, does go beyond the plate, offering a few other suggestions.   Use healthy plant oils, such as olive, canola, soy, corn, sunflower and peanut. Check the labels, and avoid partially hydrogenated oil, which have unhealthy trans fats.   If youre thirsty, drink water. Coffee and tea are good in moderation, but skip sugary drinks and limit milk and dairy products to one or two daily servings.   The type of carbohydrate in the  diet is more important than the amount. Some sources of carbohydrates, such as vegetables, fruits, whole grains, and beans-are healthier than others.   Finally, stay active  Signed, Berniece Salines, DO  03/26/2019 9:25 PM    [facility]

## 2019-03-26 NOTE — Patient Instructions (Signed)
Medication Instructions:  Your physician has recommended you make the following change in your medication:   START METOPROLOL SUCCINATE 12.5 MG ONCE DAILY    *If you need a refill on your cardiac medications before your next appointment, please call your pharmacy*  Lab Work: BMET Magnesium   If you have labs (blood work) drawn today and your tests are completely normal, you will receive your results only by: Marland Kitchen MyChart Message (if you have MyChart) OR . A paper copy in the mail If you have any lab test that is abnormal or we need to change your treatment, we will call you to review the results.  Testing/Procedures: Your physician has requested that you have cardiac CT. Cardiac computed tomography (CT) is a painless test that uses an x-ray machine to take clear, detailed pictures of your heart. For further information please visit HugeFiesta.tn. Please follow instruction sheet as given.     Follow-Up: At [facility], you and your health needs are our priority.  As part of our continuing mission to provide you with exceptional heart care, we have created designated Provider Care Teams.  These Care Teams include your primary Cardiologist (physician) and Advanced Practice Providers (APPs -  Physician Assistants and Nurse Practitioners) who all work together to provide you with the care you need, when you need it.  Your next appointment:   3 month(s)  The format for your next appointment:   In Person  Provider:   Berniece Salines, DO  Other Instructions  Your cardiac CT will be scheduled at one of the below locations:   [facility] [address] [place], [place] [address] [phone]  Please arrive at the [facility] main entrance of [facility] 30-45 minutes prior to test start time. Proceed to the [facility] Radiology Department (first floor) to check-in and test prep.  On the Night Before the Test: . Be sure to Drink plenty of water. . Do not  consume any caffeinated/decaffeinated beverages or chocolate 12 hours prior to your test. . Do not take any antihistamines 12 hours prior to your test.  On the Day of the Test: . Drink plenty of water. Do not drink any water within one hour of the test. . Do not eat any food 4 hours prior to the test. . You may take your regular medications prior to the test.  . Take metoprolol (Lopressor) two hours prior to test.12.5 mg . FEMALES- please wear underwire-free bra if available       After the Test: . Drink plenty of water. . After receiving IV contrast, you may experience a mild flushed feeling. This is normal. . On occasion, you may experience a mild rash up to 24 hours after the test. This is not dangerous. If this occurs, you can take Benadryl 25 mg and increase your fluid intake. . If you experience trouble breathing, this can be serious. If it is severe call 911 IMMEDIATELY. If it is mild, please call our office.  Once we have confirmed authorization from your insurance company, we will call you to set up a date and time for your test.   For non-scheduling related questions, please contact the cardiac imaging nurse navigator should you have any questions/concerns: Marchia Bond, RN Navigator Cardiac Imaging [facility] [phone] Office

## 2019-03-27 LAB — MAGNESIUM: Magnesium: 2.1 mg/dL (ref 1.6–2.3)

## 2019-03-29 ENCOUNTER — Telehealth: Payer: Self-pay | Admitting: *Deleted

## 2019-03-29 NOTE — Telephone Encounter (Signed)
-----   Message from Berniece Salines, DO sent at 03/27/2019  9:42 PM EST ----- Magnesium normal.

## 2019-03-29 NOTE — Telephone Encounter (Signed)
Telephone call to patient. Left message that labs were normal and to call with any questions.

## 2019-04-02 ENCOUNTER — Encounter: Payer: Self-pay | Admitting: Family

## 2019-04-02 MED ORDER — METOPROLOL SUCCINATE ER 25 MG PO TB24: 12.5000 mg | ORAL_TABLET | Freq: Every day | ORAL | 2 refills | Status: DC

## 2019-04-21 ENCOUNTER — Encounter: Payer: Self-pay | Admitting: Family

## 2019-05-11 LAB — BASIC METABOLIC PANEL
BUN/Creatinine Ratio: 14 (ref 9–23)
BUN: 13 mg/dL (ref 6–24)
CO2: 25 mmol/L (ref 20–29)
Calcium: 9.3 mg/dL (ref 8.7–10.2)
Chloride: 104 mmol/L (ref 96–106)
Creatinine, Ser: 0.9 mg/dL (ref 0.57–1.00)
GFR calc Af Amer: 89 mL/min/{1.73_m2} (ref >59)
GFR calc non Af Amer: 77 mL/min/{1.73_m2} (ref >59)
Glucose: 61 mg/dL — ABNORMAL LOW (ref 65–99)
Potassium: 5.3 mmol/L — ABNORMAL HIGH (ref 3.5–5.2)
Sodium: 142 mmol/L (ref 134–144)

## 2019-05-11 LAB — MAGNESIUM: Magnesium: 2.2 mg/dL (ref 1.6–2.3)

## 2019-05-12 ENCOUNTER — Encounter (HOSPITAL_COMMUNITY): Payer: Self-pay

## 2019-05-12 ENCOUNTER — Telehealth (HOSPITAL_COMMUNITY): Payer: Self-pay | Admitting: Emergency Medicine

## 2019-05-12 NOTE — Telephone Encounter (Signed)
Left message on voicemail with name and callback number Shalisha Clausing RN Navigator Cardiac Imaging [facility] [phone] Office [phone] Cell  

## 2019-05-13 ENCOUNTER — Telehealth: Payer: Self-pay | Admitting: *Deleted

## 2019-05-13 ENCOUNTER — Ambulatory Visit
Admission: RE | Admit: 2019-05-13 | Discharge: 2019-05-13 | Disposition: A | Discharge status: Home / Self Care | Payer: BC Managed Care – PPO | Source: Ambulatory Visit | Attending: Cardiology | Admitting: Cardiology

## 2019-05-13 ENCOUNTER — Other Ambulatory Visit: Payer: Self-pay

## 2019-05-13 DIAGNOSIS — I4729 Other ventricular tachycardia: Secondary | ICD-10-CM

## 2019-05-13 DIAGNOSIS — E785 Hyperlipidemia, unspecified: Secondary | ICD-10-CM | POA: Diagnosis not present

## 2019-05-13 DIAGNOSIS — I472 Ventricular tachycardia: Secondary | ICD-10-CM | POA: Diagnosis not present

## 2019-05-13 DIAGNOSIS — R079 Chest pain, unspecified: Secondary | ICD-10-CM | POA: Diagnosis not present

## 2019-05-13 MED ORDER — IOHEXOL 350 MG/ML SOLN: 125.0000 mL | Freq: Once | INTRAVENOUS | Status: DC | PRN

## 2019-05-13 MED ORDER — NITROGLYCERIN 0.4 MG SL SUBL
0.8000 mg | SUBLINGUAL_TABLET | Freq: Once | SUBLINGUAL | Status: AC
  Administered 2019-05-13: 0.8 mg via SUBLINGUAL

## 2019-05-13 MED ORDER — METOPROLOL SUCCINATE ER 100 MG PO TB24
100.0000 mg | ORAL_TABLET | Freq: Every day | ORAL | Status: DC
  Administered 2019-05-13: 100 mg via ORAL

## 2019-05-13 MED ORDER — METOPROLOL TARTRATE 5 MG/5ML IV SOLN
5.0000 mg | INTRAVENOUS | Status: DC | PRN
  Administered 2019-05-13 (×2): 5 mg via INTRAVENOUS

## 2019-05-13 MED ORDER — IOPAMIDOL (ISOVUE-370) INJECTION 76%
125.0000 mL | Freq: Once | INTRAVENOUS | Status: AC | PRN
  Administered 2019-05-13: 15:00:00 125 mL via INTRAVENOUS

## 2019-05-13 MED ORDER — METOPROLOL TARTRATE 100 MG PO TABS: 100.0000 mg | ORAL_TABLET | Freq: Once | ORAL | Status: DC

## 2019-05-13 NOTE — Progress Notes (Addendum)
Patient tolerated CT without incident. Drank water and coffee after. Ambulated to to exit steady gait. Stated headache 3/10 throbbing.

## 2019-05-13 NOTE — Telephone Encounter (Signed)
-----   Message from Berniece Salines, DO sent at 05/12/2019  5:32 PM EST ----- Potassium slightly elevated. We will repeat labs in one week. Please decrease your intake of high potassium food intake

## 2019-05-13 NOTE — Telephone Encounter (Signed)
Telephone call to patient. Left message with lab results and to decrease potassium rich foods then repeat BMP in 1 week. Pt to call with any questions.

## 2019-05-14 ENCOUNTER — Encounter: Payer: Self-pay | Admitting: *Deleted

## 2019-05-25 DIAGNOSIS — E7800 Pure hypercholesterolemia, unspecified: Secondary | ICD-10-CM

## 2019-05-25 HISTORY — DX: Pure hypercholesterolemia, unspecified: E78.00

## 2019-05-29 LAB — BASIC METABOLIC PANEL
BUN/Creatinine Ratio: 16 (ref 9–23)
BUN: 12 mg/dL (ref 6–24)
CO2: 22 mmol/L (ref 20–29)
Calcium: 9.1 mg/dL (ref 8.7–10.2)
Chloride: 104 mmol/L (ref 96–106)
Creatinine, Ser: 0.74 mg/dL (ref 0.57–1.00)
GFR calc Af Amer: 112 mL/min/{1.73_m2} (ref >59)
GFR calc non Af Amer: 97 mL/min/{1.73_m2} (ref >59)
Glucose: 91 mg/dL (ref 65–99)
Potassium: 4.5 mmol/L (ref 3.5–5.2)
Sodium: 139 mmol/L (ref 134–144)

## 2019-05-31 ENCOUNTER — Telehealth: Payer: Self-pay | Admitting: *Deleted

## 2019-05-31 NOTE — Telephone Encounter (Signed)
Telephone call to patient. Left message with lab results and that they are on My Chart to view. Pt to call with any questions.

## 2019-05-31 NOTE — Telephone Encounter (Signed)
-----   Message from Berniece Salines, DO sent at 05/31/2019  9:03 AM EST ----- Normal labs, your creatinine has improved compared to 3 weeks ago.

## 2019-06-18 ENCOUNTER — Other Ambulatory Visit: Payer: Self-pay

## 2019-06-18 ENCOUNTER — Ambulatory Visit (INDEPENDENT_AMBULATORY_CARE_PROVIDER_SITE_OTHER): Payer: BC Managed Care – PPO | Admitting: Cardiology

## 2019-06-18 ENCOUNTER — Encounter: Payer: Self-pay | Admitting: Cardiology

## 2019-06-18 VITALS — BP 122/84 | HR 84 | Ht 65.5 in | Wt 204.0 lb

## 2019-06-18 DIAGNOSIS — I472 Ventricular tachycardia: Secondary | ICD-10-CM

## 2019-06-18 DIAGNOSIS — E782 Mixed hyperlipidemia: Secondary | ICD-10-CM

## 2019-06-18 DIAGNOSIS — E669 Obesity, unspecified: Secondary | ICD-10-CM | POA: Diagnosis not present

## 2019-06-18 DIAGNOSIS — I4729 Other ventricular tachycardia: Secondary | ICD-10-CM

## 2019-06-18 DIAGNOSIS — R0789 Other chest pain: Secondary | ICD-10-CM | POA: Diagnosis not present

## 2019-06-18 MED ORDER — DILTIAZEM HCL ER COATED BEADS 120 MG PO CP24: 120.0000 mg | ORAL_CAPSULE | Freq: Every day | ORAL | 1 refills | Status: DC

## 2019-06-18 NOTE — Patient Instructions (Signed)
Medication Instructions:  Your physician has recommended you make the following change in your medication:   STOP: Toprol XL(metoprolol succinate)  START Cardizem CD(diltiazem) 120 mg Take 1 tab daily  *If you need a refill on your cardiac medications before your next appointment, please call your pharmacy*   Lab Work: NOne If you have labs (blood work) drawn today and your tests are completely normal, you will receive your results only by: Marland Kitchen MyChart Message (if you have MyChart) OR . A paper copy in the mail If you have any lab test that is abnormal or we need to change your treatment, we will call you to review the results.   Testing/Procedures: nOne   Follow-Up: At [facility], you and your health needs are our priority.  As part of our continuing mission to provide you with exceptional heart care, we have created designated Provider Care Teams.  These Care Teams include your primary Cardiologist (physician) and Advanced Practice Providers (APPs -  Physician Assistants and Nurse Practitioners) who all work together to provide you with the care you need, when you need it.  We recommend signing up for the patient portal called "MyChart".  Sign up information is provided on this After Visit Summary.  MyChart is used to connect with patients for Virtual Visits (Telemedicine).  Patients are able to view lab/test results, encounter notes, upcoming appointments, etc.  Non-urgent messages can be sent to your provider as well.   To learn more about what you can do with MyChart, go to [email or web address].    Your next appointment:   3 month(s)  The format for your next appointment:   In Person  Provider:   Berniece Salines, DO   Other Instructions

## 2019-06-18 NOTE — Progress Notes (Signed)
Cardiology Office Note:    Date:  06/18/2019   ID:  [name], DOB [birth year], MRN [id number]  PCP:  Hali Marry, MD  Cardiologist:  Berniece Salines, DO  Electrophysiologist:  None   Referring MD: Hali Marry, *   Follow-up visit  History of Present Illness:    Rachel Cisneros is a 49 y.o. female with a hx of chronic back pain, initially presented to be evaluated for palpitations.  At which time ZIO monitor was placed on the patient.  She followed up on March 26, 2019 to discuss her monitor results which show evidence of symptomatic sustained ventricular tachycardia.  In addition during her visit she reported to started experience tomato midsternal chest pain.  Given her risk factors CTA coronary was recommended.  He is here today for follow-up visit to discuss her CT results.  She reports that she is still experiencing intermittent palpitations.  Along with the chest tightness.  He tells me that the chest tightness usually after her fluttering sensation.  Past Medical History:  Diagnosis Date  . Chronic back pain 02/02/2019  . Elevated LDL cholesterol level 05/25/2019  . Palpitation 02/02/2019  . Palpitations   . Scoliosis 02/02/2019  . Skipped heart beats 02/10/2019  . Stress incontinence 02/02/2019  . Tachycardia     Past Surgical History:  Procedure Laterality Date  . TONSILLECTOMY      Current Medications: Current Meds  Medication Sig  . valACYclovir (VALTREX) 500 MG tablet Take 1 tablet by mouth 2 (two) times daily as needed.  . [DISCONTINUED] metoprolol succinate (TOPROL XL) 25 MG 24 hr tablet Take 0.5 tablets (12.5 mg total) by mouth daily.     Allergies:   Codeine   Social History   Socioeconomic History  . Marital status: Married    Spouse name: Not on file  . Number of children: Not on file  . Years of education: Not on file  . Highest education level: Not on file  Occupational History  . Not on file  Tobacco Use  . Smoking status:  Former Smoker    Types: Cigarettes    Quit date: 04/15/2001    Years since quitting: 18.1  . Smokeless tobacco: Never Used  Substance and Sexual Activity  . Alcohol use: Yes    Alcohol/week: 2.0 standard drinks    Types: 2 Standard drinks or equivalent per week  . Drug use: Never  . Sexual activity: Yes    Partners: Male    Birth control/protection: I.U.D.  Other Topics Concern  . Not on file  Social History Narrative  . Not on file   Social Determinants of Health   Financial Resource Strain:   . Difficulty of Paying Living Expenses: Not on file  Food Insecurity:   . Worried About Charity fundraiser in the Last Year: Not on file  . Ran Out of Food in the Last Year: Not on file  Transportation Needs:   . Lack of Transportation (Medical): Not on file  . Lack of Transportation (Non-Medical): Not on file  Physical Activity:   . Days of Exercise per Week: Not on file  . Minutes of Exercise per Session: Not on file  Stress:   . Feeling of Stress : Not on file  Social Connections:   . Frequency of Communication with Friends and Family: Not on file  . Frequency of Social Gatherings with Friends and Family: Not on file  . Attends Religious Services: Not on file  .  Active Member of Clubs or Organizations: Not on file  . Attends Archivist Meetings: Not on file  . Marital Status: Not on file     Family History: The patient's family history includes Brain cancer (age of onset: 61) in her mother; Lung cancer (age of onset: 51) in her maternal grandfather; Sudden Cardiac Death (age of onset: 60) in her maternal grandmother.  ROS:   Review of Systems  Constitution: Negative for decreased appetite, fever and weight gain.  HENT: Negative for congestion, ear discharge, hoarse voice and sore throat.   Eyes: Negative for discharge, redness, vision loss in right eye and visual halos.  Cardiovascular: Negative for chest pain, dyspnea on exertion, leg swelling, orthopnea and  palpitations.  Respiratory: Negative for cough, hemoptysis, shortness of breath and snoring.   Endocrine: Negative for heat intolerance and polyphagia.  Hematologic/Lymphatic: Negative for bleeding problem. Does not bruise/bleed easily.  Skin: Negative for flushing, nail changes, rash and suspicious lesions.  Musculoskeletal: Negative for arthritis, joint pain, muscle cramps, myalgias, neck pain and stiffness.  Gastrointestinal: Negative for abdominal pain, bowel incontinence, diarrhea and excessive appetite.  Genitourinary: Negative for decreased libido, genital sores and incomplete emptying.  Neurological: Negative for brief paralysis, focal weakness, headaches and loss of balance.  Psychiatric/Behavioral: Negative for altered mental status, depression and suicidal ideas.  Allergic/Immunologic: Negative for HIV exposure and persistent infections.    EKGs/Labs/Other Studies Reviewed:    The following studies were reviewed today:   EKG: None today  TTE IMPRESSION: 05/13/2019 1. Coronary calcium score of 0.  2. Normal coronary origin with right dominance.  3. No evidence of CAD.  4. CAD-RADS 0. No evidence of CAD (0%). Consider non-atherosclerotic causes of chest pain.   ZIO monitor Baseline rhythm: Sinus  Minimum heart rate: 52 BPM.  Average heart rate: 83 BPM.  Maximal heart rate 226 BPM.  Atrial arrhythmia: Infrequent PACs  Ventricular arrhythmia: 2 runs of ventricular tachycardia : 4 beats at rate of 226 symptomatic, second  6 beats at rate of 132 bpm, also ventricle ectopy noted.  Conduction abnormality: None  Symptoms: Multiple trigger events majority of those showing only sinus rhythm.  However ventricular tachycardia was also felt as fluttering.  Conclusion:  Symptomatic ventricular tachycardia, nonsustained.   Recent Labs: 02/01/2019: ALT 8; Hemoglobin 14.8; Platelets 318; TSH 0.89 05/10/2019: Magnesium 2.2 05/28/2019: BUN 12; Creatinine, Ser 0.74;  Potassium 4.5; Sodium 139  Recent Lipid Panel    Component Value Date/Time   CHOL 204 (H) 02/01/2019 1034   TRIG 63 02/01/2019 1034   HDL 63 02/01/2019 1034   CHOLHDL 3.2 02/01/2019 1034   LDLCALC 126 (H) 02/01/2019 1034    Physical Exam:    VS:  BP 122/84 (BP Location: Left Arm, Patient Position: Sitting, Cuff Size: Normal)   Pulse 84   Ht 5' 5.5" (1.664 m)   Wt 204 lb (92.5 kg)   SpO2 97%   BMI 33.43 kg/m     Wt Readings from Last 3 Encounters:  06/18/19 204 lb (92.5 kg)  03/26/19 202 lb (91.6 kg)  02/19/19 198 lb 8 oz (90 kg)     GEN: Well nourished, well developed in no acute distress HEENT: Normal NECK: No JVD; No carotid bruits LYMPHATICS: No lymphadenopathy CARDIAC: S1S2 noted,RRR, no murmurs, rubs, gallops RESPIRATORY:  Clear to auscultation without rales, wheezing or rhonchi  ABDOMEN: Soft, non-tender, non-distended, +bowel sounds, no guarding. EXTREMITIES: No edema, No cyanosis, no clubbing MUSCULOSKELETAL:  No deformity  SKIN: Warm and  dry NEUROLOGIC:  Alert and oriented x 3, non-focal PSYCHIATRIC:  Normal affect, good insight  ASSESSMENT:    1. NSVT (nonsustained ventricular tachycardia) (HCC)   2. Chest tightness   3. Mixed hyperlipidemia   4. Obesity (BMI 30-39.9)    PLAN:    Given that the patient is still experiencing symptoms on metoprolol 12.5 mg daily I like to switch her medications for her especially now with my suspicion of coronary vasospasm.  We will start her on diltiazem 120 mg daily and hopefully titrate up.  I spoke with the patient in great length about her coronary CTA reporting calcium score 0 with no plaques which suggest nonatherosclerotic causes of her chest pain.  For now symptom control is what we hoped for.  Hyperlipidemia-diet modification per patient.  We will repeat lipid profile in 3 months.  Obesity-the patient understands the need to lose weight with diet and exercise. We have discussed specific strategies for  this.  The patient also asked me to update her husband who was in the waiting room and I would now and spoke with him about our discussion visit this time he offers no questions.  The patient is in agreement with the above plan. The patient left the office in stable condition.  The patient will follow up in 3 months or sooner if needed   Medication Adjustments/Labs and Tests Ordered: Current medicines are reviewed at length with the patient today.  Concerns regarding medicines are outlined above.  No orders of the defined types were placed in this encounter.  Meds ordered this encounter  Medications  . diltiazem (CARDIZEM CD) 120 MG 24 hr capsule    Sig: Take 1 capsule (120 mg total) by mouth daily.    Dispense:  90 capsule    Refill:  1    Patient Instructions  Medication Instructions:  Your physician has recommended you make the following change in your medication:   STOP: Toprol XL(metoprolol succinate)  START Cardizem CD(diltiazem) 120 mg Take 1 tab daily  *If you need a refill on your cardiac medications before your next appointment, please call your pharmacy*   Lab Work: NOne If you have labs (blood work) drawn today and your tests are completely normal, you will receive your results only by: Marland Kitchen MyChart Message (if you have MyChart) OR . A paper copy in the mail If you have any lab test that is abnormal or we need to change your treatment, we will call you to review the results.   Testing/Procedures: nOne   Follow-Up: At [facility], you and your health needs are our priority.  As part of our continuing mission to provide you with exceptional heart care, we have created designated Provider Care Teams.  These Care Teams include your primary Cardiologist (physician) and Advanced Practice Providers (APPs -  Physician Assistants and Nurse Practitioners) who all work together to provide you with the care you need, when you need it.  We recommend signing up for the  patient portal called "MyChart".  Sign up information is provided on this After Visit Summary.  MyChart is used to connect with patients for Virtual Visits (Telemedicine).  Patients are able to view lab/test results, encounter notes, upcoming appointments, etc.  Non-urgent messages can be sent to your provider as well.   To learn more about what you can do with MyChart, go to [email or web address].    Your next appointment:   3 month(s)  The format for your next appointment:   In Person  Provider:   Berniece Salines, DO   Other Instructions      Adopting a Healthy Lifestyle.  Know what a healthy weight is for you (roughly BMI <25) and aim to maintain this   Aim for 7+ servings of fruits and vegetables daily   65-80+ fluid ounces of water or unsweet tea for healthy kidneys   Limit to max 1 drink of alcohol per day; avoid smoking/tobacco   Limit animal fats in diet for cholesterol and heart health - choose grass fed whenever available   Avoid highly processed foods, and foods high in saturated/trans fats   Aim for low stress - take time to unwind and care for your mental health   Aim for 150 min of moderate intensity exercise weekly for heart health, and weights twice weekly for bone health   Aim for 7-9 hours of sleep daily   When it comes to diets, agreement about the perfect plan isnt easy to find, even among the experts. Experts at the [employer] developed an idea known as the Healthy Eating Plate. Just imagine a plate divided into logical, healthy portions.   The emphasis is on diet quality:   Load up on vegetables and fruits - one-half of your plate: Aim for color and variety, and remember that potatoes dont count.   Go for whole grains - one-quarter of your plate: Whole wheat, barley, wheat berries, quinoa, oats, brown rice, and foods made with them. If you want pasta, go with whole wheat pasta.   Protein power - one-quarter of your plate: Fish,  chicken, beans, and nuts are all healthy, versatile protein sources. Limit red meat.   The diet, however, does go beyond the plate, offering a few other suggestions.   Use healthy plant oils, such as olive, canola, soy, corn, sunflower and peanut. Check the labels, and avoid partially hydrogenated oil, which have unhealthy trans fats.   If youre thirsty, drink water. Coffee and tea are good in moderation, but skip sugary drinks and limit milk and dairy products to one or two daily servings.   The type of carbohydrate in the diet is more important than the amount. Some sources of carbohydrates, such as vegetables, fruits, whole grains, and beans-are healthier than others.   Finally, stay active  Signed, Berniece Salines, DO  06/18/2019 10:50 AM    [place]

## 2019-08-12 ENCOUNTER — Ambulatory Visit (INDEPENDENT_AMBULATORY_CARE_PROVIDER_SITE_OTHER): Payer: BC Managed Care – PPO

## 2019-08-12 ENCOUNTER — Other Ambulatory Visit: Payer: Self-pay

## 2019-08-12 ENCOUNTER — Ambulatory Visit (INDEPENDENT_AMBULATORY_CARE_PROVIDER_SITE_OTHER): Payer: BC Managed Care – PPO | Admitting: Family Medicine

## 2019-08-12 ENCOUNTER — Encounter: Payer: Self-pay | Admitting: Family Medicine

## 2019-08-12 VITALS — BP 107/64 | HR 84 | Ht 66.0 in | Wt 204.0 lb

## 2019-08-12 DIAGNOSIS — R0789 Other chest pain: Secondary | ICD-10-CM | POA: Diagnosis not present

## 2019-08-12 DIAGNOSIS — K449 Diaphragmatic hernia without obstruction or gangrene: Secondary | ICD-10-CM

## 2019-08-12 DIAGNOSIS — R079 Chest pain, unspecified: Secondary | ICD-10-CM

## 2019-08-12 HISTORY — DX: Diaphragmatic hernia without obstruction or gangrene: K44.9

## 2019-08-12 NOTE — Progress Notes (Signed)
L sided CP that began this morning. She reports that it is behind her L Breast and goes thru to her back and feels like a squeezing and it feels uncomfortable. Denies any jaw pain or pain radiating into her arm,no SOB. She has had some episodes of belching and "waves" of nausea that also began this morning.

## 2019-08-12 NOTE — Progress Notes (Signed)
Acute Office Visit  Subjective:    Patient ID: Rachel Cisneros, female    DOB: 1970-07-16, 49 y.o.   MRN: [id number]  Chief Complaint  Patient presents with  . Chest Pain    HPI Patient is in today for chest pain that started this morning.  She says is mostly on the left side most mid breast area but radiating towards her back on that left side.  She currently rates her pain a 3 out of 10.  She has had problems with palpitations and some chest tightness with her palpitations that she has been seeing Dr. Petra Kuba, her cardiologist for.  Out a month ago she was switched from metoprolol to Cardizem and says it actually has been really helpful she feels like her palpitations are under better control and she has been getting much less of the tightness and squeezing sensation in her chest.  She says even when she was experiencing that sensation previously it was brief but today her pain has lasted all morning which is unusual.  But she also reports that she has been belching and hiccuping this today.  She did eat this morning but did not notice any difference after she ate in regards to the discomfort.  She denies any increased shortness of breath.  She is has had waves of nausea.  She is never had any problems with her pancreas.  She was recently diagnosed with a hiatal hernia.  She is not tried taking any heartburn or reflux medicines and she is not currently taking any of those medicines.  Recent negative stress test and calcium score of 0  Past Medical History:  Diagnosis Date  . Chronic back pain 02/02/2019  . Elevated LDL cholesterol level 05/25/2019  . Palpitation 02/02/2019  . Palpitations   . Scoliosis 02/02/2019  . Skipped heart beats 02/10/2019  . Stress incontinence 02/02/2019  . Tachycardia     Past Surgical History:  Procedure Laterality Date  . TONSILLECTOMY      Family History  Problem Relation Age of Onset  . Brain cancer Mother 60  . Lung cancer Maternal Grandfather 57  .  Sudden Cardiac Death Maternal Grandmother 23    Social History   Socioeconomic History  . Marital status: Married    Spouse name: Not on file  . Number of children: Not on file  . Years of education: Not on file  . Highest education level: Not on file  Occupational History  . Not on file  Tobacco Use  . Smoking status: Former Smoker    Types: Cigarettes    Quit date: 04/15/2001    Years since quitting: 18.3  . Smokeless tobacco: Never Used  Substance and Sexual Activity  . Alcohol use: Yes    Alcohol/week: 2.0 standard drinks    Types: 2 Standard drinks or equivalent per week  . Drug use: Never  . Sexual activity: Yes    Partners: Male    Birth control/protection: I.U.D.  Other Topics Concern  . Not on file  Social History Narrative  . Not on file   Social Determinants of Health   Financial Resource Strain:   . Difficulty of Paying Living Expenses:   Food Insecurity:   . Worried About Charity fundraiser in the Last Year:   . Arboriculturist in the Last Year:   Transportation Needs:   . Film/video editor (Medical):   Marland Kitchen Lack of Transportation (Non-Medical):   Physical Activity:   .  Days of Exercise per Week:   . Minutes of Exercise per Session:   Stress:   . Feeling of Stress :   Social Connections:   . Frequency of Communication with Friends and Family:   . Frequency of Social Gatherings with Friends and Family:   . Attends Religious Services:   . Active Member of Clubs or Organizations:   . Attends Archivist Meetings:   Marland Kitchen Marital Status:   Intimate Partner Violence:   . Fear of Current or Ex-Partner:   . Emotionally Abused:   Marland Kitchen Physically Abused:   . Sexually Abused:     Outpatient Medications Prior to Visit  Medication Sig Dispense Refill  . diltiazem (CARDIZEM CD) 120 MG 24 hr capsule Take 1 capsule (120 mg total) by mouth daily. 90 capsule 1  . valACYclovir (VALTREX) 500 MG tablet Take 1 tablet by mouth 2 (two) times daily as needed.      No facility-administered medications prior to visit.    Allergies  Allergen Reactions  . Codeine Shortness Of Breath and Swelling    Review of Systems     Objective:    Physical Exam Vitals reviewed.  Constitutional:      Appearance: She is well-developed.  HENT:     Head: Normocephalic and atraumatic.  Eyes:     Conjunctiva/sclera: Conjunctivae normal.  Cardiovascular:     Rate and Rhythm: Normal rate and regular rhythm.     Heart sounds: Normal heart sounds.  Pulmonary:     Effort: Pulmonary effort is normal.     Breath sounds: Normal breath sounds.  Abdominal:     General: Abdomen is flat. Bowel sounds are normal.     Palpations: Abdomen is soft. There is no mass.     Tenderness: There is no abdominal tenderness. There is no guarding.  Skin:    General: Skin is warm and dry.     Coloration: Skin is not pale.  Neurological:     Mental Status: She is alert and oriented to person, place, and time.  Psychiatric:        Behavior: Behavior normal.     BP 107/64   Pulse 84   Ht 5' 6"  (1.676 m)   Wt 204 lb (92.5 kg)   SpO2 99%   BMI 32.93 kg/m  Wt Readings from Last 3 Encounters:  08/12/19 204 lb (92.5 kg)  06/18/19 204 lb (92.5 kg)  03/26/19 202 lb (91.6 kg)    Health Maintenance Due  Topic Date Due  . HIV Screening  Never done  . COVID-19 Vaccine (1) Never done  . TETANUS/TDAP  Never done  . PAP SMEAR-Modifier  Never done    There are no preventive care reminders to display for this patient.   Lab Results  Component Value Date   TSH 0.89 02/01/2019   Lab Results  Component Value Date   WBC 4.6 02/01/2019   HGB 14.8 02/01/2019   HCT 44.5 02/01/2019   MCV 90.8 02/01/2019   PLT 318 02/01/2019   Lab Results  Component Value Date   NA 139 05/28/2019   K 4.5 05/28/2019   CO2 22 05/28/2019   GLUCOSE 91 05/28/2019   BUN 12 05/28/2019   CREATININE 0.74 05/28/2019   BILITOT 0.6 02/01/2019   AST 12 02/01/2019   ALT 8 02/01/2019   PROT 6.4  02/01/2019   CALCIUM 9.1 05/28/2019   Lab Results  Component Value Date   CHOL 204 (H) 02/01/2019  Lab Results  Component Value Date   HDL 63 02/01/2019   Lab Results  Component Value Date   LDLCALC 126 (H) 02/01/2019   Lab Results  Component Value Date   TRIG 63 02/01/2019   Lab Results  Component Value Date   CHOLHDL 3.2 02/01/2019   No results found for: HGBA1C     Assessment & Plan:   Problem List Items Addressed This Visit      Other   Hiatal hernia    Other Visit Diagnoses    Atypical chest pain    -  Primary   Relevant Orders   COMPLETE METABOLIC PANEL WITH GFR   CBC   TSH   DG Chest 2 View   Troponin I   Left-sided chest pain         Atypical/left-sided chest pain-unclear etiology suspect that it could be GERD related especially with history of hiatal hernia.  Given Mylanta here in the office to see if it would improve her symptoms.  We did opt to do blood work and chest x-ray today though she is not short of breath and lungs are clear.  She is overall low risk for a pulmonary embolism no recent travel, not on birth control etc.  She was actually belching here in the office so again I think this could be GI related I think she would benefit from starting a PPI later today.  Not having significant nausea or tenderness on abdominal exam so pancreatitis etc. is less likely will check a CBC.  EKG today shows rate of 84 bpm, normal sinus rhythm.  She has low voltage QRS and unchanged from previous EKG on file from October.  Capitation's-improved on Cardizem versus metoprolol  No orders of the defined types were placed in this encounter.    Beatrice Lecher, MD

## 2019-08-13 LAB — COMPLETE METABOLIC PANEL WITH GFR
AG Ratio: 2.2 (calc) (ref 1.0–2.5)
ALT: 11 U/L (ref 6–29)
AST: 13 U/L (ref 10–35)
Albumin: 4.6 g/dL (ref 3.6–5.1)
Alkaline phosphatase (APISO): 39 U/L (ref 31–125)
BUN: 10 mg/dL (ref 7–25)
CO2: 25 mmol/L (ref 20–32)
Calcium: 9.2 mg/dL (ref 8.6–10.2)
Chloride: 106 mmol/L (ref 98–110)
Creat: 0.78 mg/dL (ref 0.50–1.10)
GFR, Est African American: 105 mL/min/{1.73_m2} (ref >=60)
GFR, Est Non African American: 91 mL/min/{1.73_m2} (ref >=60)
Globulin: 2.1 g/dL (calc) (ref 1.9–3.7)
Glucose, Bld: 98 mg/dL (ref 65–139)
Potassium: 4.2 mmol/L (ref 3.5–5.3)
Sodium: 140 mmol/L (ref 135–146)
Total Bilirubin: 0.5 mg/dL (ref 0.2–1.2)
Total Protein: 6.7 g/dL (ref 6.1–8.1)

## 2019-08-13 LAB — CBC
HCT: 44.1 % (ref 35.0–45.0)
Hemoglobin: 14.6 g/dL (ref 11.7–15.5)
MCH: 30.7 pg (ref 27.0–33.0)
MCHC: 33.1 g/dL (ref 32.0–36.0)
MCV: 92.6 fL (ref 80.0–100.0)
MPV: 9.4 fL (ref 7.5–12.5)
Platelets: 313 10*3/uL (ref 140–400)
RBC: 4.76 10*6/uL (ref 3.80–5.10)
RDW: 11.8 % (ref 11.0–15.0)
WBC: 7.4 10*3/uL (ref 3.8–10.8)

## 2019-08-13 LAB — TROPONIN I: Troponin I: <0.01 ng/mL (ref <=0.0)

## 2019-08-13 LAB — TSH: TSH: 0.62 mIU/L

## 2019-08-16 NOTE — Addendum Note (Signed)
Addended by: Narda Rutherford on: 08/16/2019 07:56 AM   Modules accepted: Orders

## 2019-10-05 ENCOUNTER — Telehealth: Payer: Self-pay

## 2019-10-05 ENCOUNTER — Encounter: Payer: Self-pay | Admitting: Family Medicine

## 2019-10-05 DIAGNOSIS — Z1239 Encounter for other screening for malignant neoplasm of breast: Secondary | ICD-10-CM

## 2019-10-05 NOTE — Telephone Encounter (Signed)
Ordered Bilateral Diagnostic Mammogram for [facility]. Last report recommended diagnostic. In Care Everywhere. I did abstract into chart.

## 2019-10-05 NOTE — Telephone Encounter (Signed)
Order signed and faxed.

## 2019-10-29 DIAGNOSIS — R921 Mammographic calcification found on diagnostic imaging of breast: Secondary | ICD-10-CM | POA: Diagnosis not present

## 2019-10-29 DIAGNOSIS — Z09 Encounter for follow-up examination after completed treatment for conditions other than malignant neoplasm: Secondary | ICD-10-CM | POA: Diagnosis not present

## 2019-10-29 LAB — HM MAMMOGRAPHY

## 2019-11-03 ENCOUNTER — Encounter: Payer: Self-pay | Admitting: *Deleted

## 2019-12-01 ENCOUNTER — Ambulatory Visit: Payer: BC Managed Care – PPO | Admitting: Cardiology

## 2019-12-04 DIAGNOSIS — Z20822 Contact with and (suspected) exposure to covid-19: Secondary | ICD-10-CM | POA: Diagnosis not present

## 2019-12-09 DIAGNOSIS — U071 COVID-19: Secondary | ICD-10-CM | POA: Diagnosis not present

## 2019-12-22 ENCOUNTER — Telehealth: Payer: Self-pay | Admitting: Cardiology

## 2019-12-22 MED ORDER — DILTIAZEM HCL ER COATED BEADS 120 MG PO CP24: 120.0000 mg | ORAL_CAPSULE | Freq: Every day | ORAL | 1 refills | Status: DC

## 2019-12-22 NOTE — Telephone Encounter (Signed)
Refill sent in per request.  

## 2019-12-22 NOTE — Telephone Encounter (Signed)
*  STAT* If patient is at the pharmacy, call can be transferred to refill team.   1. Which medications need to be refilled? (please list name of each medication and dose if known) diltiazem (CARDIZEM CD) 120 MG 24 hr capsule(Expired)  2. Which pharmacy/location (including street and city if local pharmacy) is medication to be sent to? [id number] - [place], [place] - [address] AT [facility] [address]  3. Do they need a 30 day or 90 day supply? [place]

## 2019-12-29 ENCOUNTER — Telehealth: Payer: Self-pay | Admitting: Family Medicine

## 2019-12-29 NOTE — Telephone Encounter (Signed)
Is call patient and remind her that she is due for a Pap smear at least per our records.  If she has an OB/GYN that she already sees then please let us know and we can call to get a copy of that report.

## 2019-12-29 NOTE — Telephone Encounter (Signed)
LVM asking that she rtn call about pap.

## 2019-12-31 ENCOUNTER — Other Ambulatory Visit: Payer: Self-pay

## 2019-12-31 DIAGNOSIS — R002 Palpitations: Secondary | ICD-10-CM | POA: Insufficient documentation

## 2019-12-31 DIAGNOSIS — R000 Tachycardia, unspecified: Secondary | ICD-10-CM | POA: Insufficient documentation

## 2019-12-31 NOTE — Telephone Encounter (Signed)
Pt returned a call back to the clinic regarding her last pap smear. Per pt, due to Covid pandemic she is overdue for a pap smear/ IUD removal. Pt will contact her OB/GYN to schedule an appointment. She is aware that if she is unable to get an appointment with her OB/GYN, she will call the clinic for scheduling. Pt is aware that IUD removal/placement/PAP can be done in the office with Dr. Sheppard Coil if needed. No other inquiries during call.

## 2020-01-03 ENCOUNTER — Ambulatory Visit (INDEPENDENT_AMBULATORY_CARE_PROVIDER_SITE_OTHER): Payer: BC Managed Care – PPO | Admitting: Cardiology

## 2020-01-03 ENCOUNTER — Encounter: Payer: Self-pay | Admitting: Cardiology

## 2020-01-03 ENCOUNTER — Other Ambulatory Visit: Payer: Self-pay

## 2020-01-03 VITALS — BP 120/88 | HR 76 | Ht 66.0 in | Wt 200.0 lb

## 2020-01-03 DIAGNOSIS — I493 Ventricular premature depolarization: Secondary | ICD-10-CM

## 2020-01-03 DIAGNOSIS — I472 Ventricular tachycardia: Secondary | ICD-10-CM

## 2020-01-03 DIAGNOSIS — I4729 Other ventricular tachycardia: Secondary | ICD-10-CM

## 2020-01-03 MED ORDER — DILTIAZEM HCL ER COATED BEADS 180 MG PO CP24: 180.0000 mg | ORAL_CAPSULE | Freq: Every day | ORAL | 2 refills | Status: DC

## 2020-01-03 NOTE — Patient Instructions (Signed)
Medication Instructions:  Your physician has recommended you make the following change in your medication:   Increase your Cardizem to 180 mg daily.  *If you need a refill on your cardiac medications before your next appointment, please call your pharmacy*   Lab Work: None ordered If you have labs (blood work) drawn today and your tests are completely normal, you will receive your results only by: Marland Kitchen MyChart Message (if you have MyChart) OR . A paper copy in the mail If you have any lab test that is abnormal or we need to change your treatment, we will call you to review the results.   Testing/Procedures: None ordered   Follow-Up: At [facility], you and your health needs are our priority.  As part of our continuing mission to provide you with exceptional heart care, we have created designated Provider Care Teams.  These Care Teams include your primary Cardiologist (physician) and Advanced Practice Providers (APPs -  Physician Assistants and Nurse Practitioners) who all work together to provide you with the care you need, when you need it.  We recommend signing up for the patient portal called "MyChart".  Sign up information is provided on this After Visit Summary.  MyChart is used to connect with patients for Virtual Visits (Telemedicine).  Patients are able to view lab/test results, encounter notes, upcoming appointments, etc.  Non-urgent messages can be sent to your provider as well.   To learn more about what you can do with MyChart, go to [email or web address].    Your next appointment:   3 month(s)  The format for your next appointment:   In Person  Provider:   Berniece Salines, DO   Other Instructions NA

## 2020-01-03 NOTE — Progress Notes (Signed)
Cardiology Office Note:    Date:  01/03/2020   ID:  Rachel Cisneros, DOB 06/07/1970, MRN [id number]  PCP:  Hali Marry, MD  Cardiologist:  Berniece Salines, DO  Electrophysiologist:  None   Referring MD: Hali Marry, *   Chief Complaint  Patient presents with  . Follow-up   History of Present Illness:    Rachel Cisneros is a 49 y.o. female with a hx of palpitations with 0 monitor showing evidence of PVC and nonsustained ventricular tachycardia comes today for follow-up visit.  She tells me that her Cardizem 120 mg was helping but recently this has not really helped her symptoms as she is now feeling increasing fluttering.  She denies any shortness of breath but tells me she sometimes have chest tightness associated with this.  No lightheadedness or dizziness.  Past Medical History:  Diagnosis Date  . Chronic back pain 02/02/2019  . Elevated LDL cholesterol level 05/25/2019  . Hiatal hernia 08/12/2019  . Palpitation 02/02/2019  . Palpitations   . Scoliosis 02/02/2019  . Skipped heart beats 02/10/2019  . Stress incontinence 02/02/2019  . Tachycardia     Past Surgical History:  Procedure Laterality Date  . TONSILLECTOMY      Current Medications: Current Meds  Medication Sig  . diltiazem (CARDIZEM CD) 180 MG 24 hr capsule Take 1 capsule (180 mg total) by mouth daily.  . valACYclovir (VALTREX) 500 MG tablet Take 1 tablet by mouth 2 (two) times daily as needed.  . [DISCONTINUED] diltiazem (CARDIZEM CD) 120 MG 24 hr capsule Take 1 capsule (120 mg total) by mouth daily.     Allergies:   Codeine   Social History   Socioeconomic History  . Marital status: Married    Spouse name: Not on file  . Number of children: Not on file  . Years of education: Not on file  . Highest education level: Not on file  Occupational History  . Not on file  Tobacco Use  . Smoking status: Former Smoker    Types: Cigarettes    Quit date: 04/15/2001    Years since quitting: 18.7    . Smokeless tobacco: Never Used  Vaping Use  . Vaping Use: Never used  Substance and Sexual Activity  . Alcohol use: Yes    Alcohol/week: 2.0 standard drinks    Types: 2 Standard drinks or equivalent per week  . Drug use: Never  . Sexual activity: Yes    Partners: Male    Birth control/protection: I.U.D.  Other Topics Concern  . Not on file  Social History Narrative  . Not on file   Social Determinants of Health   Financial Resource Strain:   . Difficulty of Paying Living Expenses: Not on file  Food Insecurity:   . Worried About Charity fundraiser in the Last Year: Not on file  . Ran Out of Food in the Last Year: Not on file  Transportation Needs:   . Lack of Transportation (Medical): Not on file  . Lack of Transportation (Non-Medical): Not on file  Physical Activity:   . Days of Exercise per Week: Not on file  . Minutes of Exercise per Session: Not on file  Stress:   . Feeling of Stress : Not on file  Social Connections:   . Frequency of Communication with Friends and Family: Not on file  . Frequency of Social Gatherings with Friends and Family: Not on file  . Attends Religious Services: Not on file  .  Active Member of Clubs or Organizations: Not on file  . Attends Archivist Meetings: Not on file  . Marital Status: Not on file     Family History: The patient's family history includes Brain cancer (age of onset: 60) in her mother; Lung cancer (age of onset: 68) in her maternal grandfather; Sudden Cardiac Death (age of onset: 31) in her maternal grandmother.  ROS:   Review of Systems  Constitution: Negative for decreased appetite, fever and weight gain.  HENT: Negative for congestion, ear discharge, hoarse voice and sore throat.   Eyes: Negative for discharge, redness, vision loss in right eye and visual halos.  Cardiovascular: Negative for chest pain, dyspnea on exertion, leg swelling, orthopnea and palpitations.  Respiratory: Negative for cough,  hemoptysis, shortness of breath and snoring.   Endocrine: Negative for heat intolerance and polyphagia.  Hematologic/Lymphatic: Negative for bleeding problem. Does not bruise/bleed easily.  Skin: Negative for flushing, nail changes, rash and suspicious lesions.  Musculoskeletal: Negative for arthritis, joint pain, muscle cramps, myalgias, neck pain and stiffness.  Gastrointestinal: Negative for abdominal pain, bowel incontinence, diarrhea and excessive appetite.  Genitourinary: Negative for decreased libido, genital sores and incomplete emptying.  Neurological: Negative for brief paralysis, focal weakness, headaches and loss of balance.  Psychiatric/Behavioral: Negative for altered mental status, depression and suicidal ideas.  Allergic/Immunologic: Negative for HIV exposure and persistent infections.    EKGs/Labs/Other Studies Reviewed:    The following studies were reviewed today:   EKG:  None today  Zio Monitor  Baseline rhythm: Sinus  Minimum heart rate: 52 BPM.  Average heart rate: 83 BPM.  Maximal heart rate 226 BPM.  Atrial arrhythmia: Infrequent PACs  Ventricular arrhythmia: 2 runs of ventricular tachycardia : 4 beats at rate of 226 symptomatic, second  6 beats at rate of 132 bpm, also ventricle ectopy noted.  Conduction abnormality: None  Symptoms: Multiple trigger events majority of those showing only sinus rhythm.  However ventricular tachycardia was also felt as fluttering.   Recent Labs: 05/10/2019: Magnesium 2.2 08/12/2019: ALT 11; BUN 10; Creat 0.78; Hemoglobin 14.6; Platelets 313; Potassium 4.2; Sodium 140; TSH 0.62  Recent Lipid Panel    Component Value Date/Time   CHOL 204 (H) 02/01/2019 1034   TRIG 63 02/01/2019 1034   HDL 63 02/01/2019 1034   CHOLHDL 3.2 02/01/2019 1034   LDLCALC 126 (H) 02/01/2019 1034    Physical Exam:    VS:  BP 120/88 (BP Location: Left Arm, Patient Position: Sitting, Cuff Size: Normal)   Pulse 76   Ht 5' 6"  (1.676 m)    Wt 200 lb (90.7 kg)   SpO2 96%   BMI 32.28 kg/m     Wt Readings from Last 3 Encounters:  01/03/20 200 lb (90.7 kg)  08/12/19 204 lb (92.5 kg)  06/18/19 204 lb (92.5 kg)     GEN: Well nourished, well developed in no acute distress HEENT: Normal NECK: No JVD; No carotid bruits LYMPHATICS: No lymphadenopathy CARDIAC: S1S2 noted,RRR, no murmurs, rubs, gallops RESPIRATORY:  Clear to auscultation without rales, wheezing or rhonchi  ABDOMEN: Soft, non-tender, non-distended, +bowel sounds, no guarding. EXTREMITIES: No edema, No cyanosis, no clubbing MUSCULOSKELETAL:  No deformity  SKIN: Warm and dry NEUROLOGIC:  Alert and oriented x 3, non-focal PSYCHIATRIC:  Normal affect, good insight  ASSESSMENT:    1. NSVT (nonsustained ventricular tachycardia) ([place])   2. PVC (premature ventricular contraction)    PLAN:     She is currently on Cardizem 120 mg which  initially helps with her symptoms but is now expressing more palpitations.  Like to increase her Cardizem to 180 mg daily.  In terms of her chest tightness which is atypical this is not atherosclerotic in nature as her coronary CTA recently which was done showed 0 calcium score with no evidence of coronary artery disease.  This is related to her palpitations which hopefully once we can control her symptoms this will also resolve.  The patient is in agreement with the above plan. The patient left the office in stable condition.  The patient will follow up in 3 months or sooner if needed.    Medication Adjustments/Labs and Tests Ordered: Current medicines are reviewed at length with the patient today.  Concerns regarding medicines are outlined above.  No orders of the defined types were placed in this encounter.  Meds ordered this encounter  Medications  . diltiazem (CARDIZEM CD) 180 MG 24 hr capsule    Sig: Take 1 capsule (180 mg total) by mouth daily.    Dispense:  90 capsule    Refill:  2    Patient Instructions    Medication Instructions:  Your physician has recommended you make the following change in your medication:   Increase your Cardizem to 180 mg daily.  *If you need a refill on your cardiac medications before your next appointment, please call your pharmacy*   Lab Work: None ordered If you have labs (blood work) drawn today and your tests are completely normal, you will receive your results only by: Marland Kitchen MyChart Message (if you have MyChart) OR . A paper copy in the mail If you have any lab test that is abnormal or we need to change your treatment, we will call you to review the results.   Testing/Procedures: None ordered   Follow-Up: At [facility], you and your health needs are our priority.  As part of our continuing mission to provide you with exceptional heart care, we have created designated Provider Care Teams.  These Care Teams include your primary Cardiologist (physician) and Advanced Practice Providers (APPs -  Physician Assistants and Nurse Practitioners) who all work together to provide you with the care you need, when you need it.  We recommend signing up for the patient portal called "MyChart".  Sign up information is provided on this After Visit Summary.  MyChart is used to connect with patients for Virtual Visits (Telemedicine).  Patients are able to view lab/test results, encounter notes, upcoming appointments, etc.  Non-urgent messages can be sent to your provider as well.   To learn more about what you can do with MyChart, go to [email or web address].    Your next appointment:   3 month(s)  The format for your next appointment:   In Person  Provider:   Berniece Salines, DO   Other Instructions NA     Adopting a Healthy Lifestyle.  Know what a healthy weight is for you (roughly BMI <25) and aim to maintain this   Aim for 7+ servings of fruits and vegetables daily   65-80+ fluid ounces of water or unsweet tea for healthy kidneys   Limit to max 1 drink of  alcohol per day; avoid smoking/tobacco   Limit animal fats in diet for cholesterol and heart health - choose grass fed whenever available   Avoid highly processed foods, and foods high in saturated/trans fats   Aim for low stress - take time to unwind and care for your mental health   Aim for 150  min of moderate intensity exercise weekly for heart health, and weights twice weekly for bone health   Aim for 7-9 hours of sleep daily   When it comes to diets, agreement about the perfect plan isnt easy to find, even among the experts. Experts at the [place] developed an idea known as the Healthy Eating Plate. Just imagine a plate divided into logical, healthy portions.   The emphasis is on diet quality:   Load up on vegetables and fruits - one-half of your plate: Aim for color and variety, and remember that potatoes dont count.   Go for whole grains - one-quarter of your plate: Whole wheat, barley, wheat berries, quinoa, oats, brown rice, and foods made with them. If you want pasta, go with whole wheat pasta.   Protein power - one-quarter of your plate: Fish, chicken, beans, and nuts are all healthy, versatile protein sources. Limit red meat.   The diet, however, does go beyond the plate, offering a few other suggestions.   Use healthy plant oils, such as olive, canola, soy, corn, sunflower and peanut. Check the labels, and avoid partially hydrogenated oil, which have unhealthy trans fats.   If youre thirsty, drink water. Coffee and tea are good in moderation, but skip sugary drinks and limit milk and dairy products to one or two daily servings.   The type of carbohydrate in the diet is more important than the amount. Some sources of carbohydrates, such as vegetables, fruits, whole grains, and beans-are healthier than others.   Finally, stay active  Signed, Berniece Salines, DO  01/03/2020 5:05 PM    [place]

## 2020-01-04 ENCOUNTER — Telehealth: Payer: Self-pay

## 2020-01-04 NOTE — Telephone Encounter (Signed)
Left message advising of openings.

## 2020-01-04 NOTE — Telephone Encounter (Signed)
We can take her IUD out during her CPE.  I have several options open on 9/30.

## 2020-01-04 NOTE — Telephone Encounter (Signed)
Pt called requesting to schedule her annual exam with provider. Per pt, she is overdue for a PAP and wants to have her IUD removed (does not want a new IUD). She is due to lose her insurance by 01/14/2020. Provider's schedule is booked for the next 2.5 weeks. Pls advise, thanks.

## 2020-01-11 DIAGNOSIS — Z01419 Encounter for gynecological examination (general) (routine) without abnormal findings: Secondary | ICD-10-CM | POA: Diagnosis not present

## 2020-01-11 DIAGNOSIS — Z124 Encounter for screening for malignant neoplasm of cervix: Secondary | ICD-10-CM | POA: Diagnosis not present

## 2020-01-11 DIAGNOSIS — Z30432 Encounter for removal of intrauterine contraceptive device: Secondary | ICD-10-CM | POA: Diagnosis not present

## 2020-01-11 DIAGNOSIS — Z1151 Encounter for screening for human papillomavirus (HPV): Secondary | ICD-10-CM | POA: Diagnosis not present

## 2020-01-11 LAB — HM PAP SMEAR: HM Pap smear: NORMAL

## 2020-01-11 LAB — RESULTS CONSOLE HPV: CHL HPV: NEGATIVE

## 2020-01-17 ENCOUNTER — Ambulatory Visit: Payer: BC Managed Care – PPO | Admitting: Cardiology

## 2020-03-22 ENCOUNTER — Encounter: Payer: Self-pay | Admitting: Family Medicine

## 2020-03-23 NOTE — Telephone Encounter (Signed)
Please schedule her for virtual visit this morning with Claudine Mouton.

## 2020-03-29 ENCOUNTER — Other Ambulatory Visit: Payer: Self-pay

## 2020-03-31 ENCOUNTER — Ambulatory Visit: Payer: BC Managed Care – PPO | Admitting: Cardiology

## 2020-05-03 ENCOUNTER — Ambulatory Visit: Payer: Self-pay | Admitting: Cardiology

## 2020-09-25 ENCOUNTER — Other Ambulatory Visit: Payer: Self-pay

## 2020-09-25 ENCOUNTER — Ambulatory Visit (INDEPENDENT_AMBULATORY_CARE_PROVIDER_SITE_OTHER): Payer: Self-pay | Admitting: Cardiology

## 2020-09-25 ENCOUNTER — Encounter: Payer: Self-pay | Admitting: Cardiology

## 2020-09-25 VITALS — BP 124/76 | HR 81 | Ht 65.0 in | Wt 203.0 lb

## 2020-09-25 DIAGNOSIS — I493 Ventricular premature depolarization: Secondary | ICD-10-CM

## 2020-09-25 DIAGNOSIS — E782 Mixed hyperlipidemia: Secondary | ICD-10-CM

## 2020-09-25 DIAGNOSIS — I10 Essential (primary) hypertension: Secondary | ICD-10-CM

## 2020-09-25 DIAGNOSIS — E669 Obesity, unspecified: Secondary | ICD-10-CM

## 2020-09-25 NOTE — Progress Notes (Signed)
Cardiology Office Note:    Date:  09/25/2020   ID:  Rachel Cisneros, DOB 08/15/1970, MRN [id number]  PCP:  Hali Marry, MD  Cardiologist:  Berniece Salines, DO  Electrophysiologist:  None   Referring MD: Hali Marry, *   No chief complaint on file. " I am doing well, I have had some PVCs"   History of Present Illness:    Rachel Cisneros is a 50 y.o. female with a hx of PVC, NSVT here today for a follow up visit. She tells me that she has had some palpitations and noticed a PVC on her KardiaMobile. No other complaints.  Past Medical History:  Diagnosis Date   Chronic back pain 02/02/2019   Elevated LDL cholesterol level 05/25/2019   Hiatal hernia 08/12/2019   Palpitation 02/02/2019   Palpitations    Scoliosis 02/02/2019   Skipped heart beats 02/10/2019   Stress incontinence 02/02/2019   Tachycardia     Past Surgical History:  Procedure Laterality Date   TONSILLECTOMY      Current Medications: Current Meds  Medication Sig   acyclovir (ZOVIRAX) 400 MG tablet Take 400 mg by mouth 3 (three) times daily as needed (hsv).   diltiazem (CARDIZEM CD) 180 MG 24 hr capsule Take 1 capsule (180 mg total) by mouth daily.   [DISCONTINUED] valACYclovir (VALTREX) 500 MG tablet Take 1 tablet by mouth 2 (two) times daily as needed.     Allergies:   Codeine   Social History   Socioeconomic History   Marital status: Married    Spouse name: Not on file   Number of children: Not on file   Years of education: Not on file   Highest education level: Not on file  Occupational History   Not on file  Tobacco Use   Smoking status: Former    Pack years: 0.00    Types: Cigarettes    Quit date: 04/15/2001    Years since quitting: 19.4   Smokeless tobacco: Never  Vaping Use   Vaping Use: Never used  Substance and Sexual Activity   Alcohol use: Yes    Alcohol/week: 2.0 standard drinks    Types: 2 Standard drinks or equivalent per week   Drug use: Never   Sexual activity: Yes     Partners: Male    Birth control/protection: I.U.D.  Other Topics Concern   Not on file  Social History Narrative   Not on file   Social Determinants of Health   Financial Resource Strain: Not on file  Food Insecurity: Not on file  Transportation Needs: Not on file  Physical Activity: Not on file  Stress: Not on file  Social Connections: Not on file     Family History: The patient's family history includes Brain cancer (age of onset: 57) in her mother; Lung cancer (age of onset: 51) in her maternal grandfather; Sudden Cardiac Death (age of onset: 83) in her maternal grandmother.  ROS:   Review of Systems  Constitution: Negative for decreased appetite, fever and weight gain.  HENT: Negative for congestion, ear discharge, hoarse voice and sore throat.   Eyes: Negative for discharge, redness, vision loss in right eye and visual halos.  Cardiovascular: Negative for chest pain, dyspnea on exertion, leg swelling, orthopnea and palpitations.  Respiratory: Negative for cough, hemoptysis, shortness of breath and snoring.   Endocrine: Negative for heat intolerance and polyphagia.  Hematologic/Lymphatic: Negative for bleeding problem. Does not bruise/bleed easily.  Skin: Negative for flushing, nail changes, rash  and suspicious lesions.  Musculoskeletal: Negative for arthritis, joint pain, muscle cramps, myalgias, neck pain and stiffness.  Gastrointestinal: Negative for abdominal pain, bowel incontinence, diarrhea and excessive appetite.  Genitourinary: Negative for decreased libido, genital sores and incomplete emptying.  Neurological: Negative for brief paralysis, focal weakness, headaches and loss of balance.  Psychiatric/Behavioral: Negative for altered mental status, depression and suicidal ideas.  Allergic/Immunologic: Negative for HIV exposure and persistent infections.    EKGs/Labs/Other Studies Reviewed:    The following studies were reviewed today:   EKG:  The ekg ordered  today demonstrates sinus heart rate 81 bpm with poor R wave progression suggesting old anterior wall infarction compared to prior EKG no significant change.    Zio Monitor   Baseline rhythm: Sinus   Minimum heart rate: 52 BPM.  Average heart rate: 83 BPM.  Maximal heart rate 226 BPM.   Atrial arrhythmia: Infrequent PACs   Ventricular arrhythmia: 2 runs of ventricular tachycardia : 4 beats at rate of 226 symptomatic, second  6 beats at rate of 132 bpm, also ventricle ectopy noted.   Conduction abnormality: None   Symptoms: Multiple trigger events majority of those showing only sinus rhythm.  However ventricular tachycardia was also felt as fluttering.  CCTA 04/2019 Aorta:  Normal size.  No calcifications.  No dissection.   Aortic Valve:  Trileaflet.  No calcifications.   Coronary Arteries:  Normal coronary origin.  Right dominance.   RCA is a large dominant artery that gives rise to PDA and PLA. There is no plaque.   Left main is a large artery that gives rise to LAD and LCX arteries.   LAD is a large vessel that has no plaque.   LCX is a non-dominant artery that gives rise to one large OM1 branch. There is no plaque.   Other findings:   Normal pulmonary vein drainage into the left atrium.   Normal left atrial appendage without a thrombus.   Normal size of the pulmonary artery.   IMPRESSION: 1. Coronary calcium score of 0.   2. Normal coronary origin with right dominance.   3. No evidence of CAD.   4. CAD-RADS 0. No evidence of CAD (0%). Consider non-atherosclerotic causes of chest pain.  TTE 02/19/2019 IMPRESSIONS     1. Left ventricular ejection fraction, by visual estimation, is 60 to  65%. The left ventricle has normal function. There is no left ventricular  hypertrophy.   2. Global right ventricle has normal systolic function.The right  ventricular size is normal. No increase in right ventricular wall  thickness.   3. Left atrial size was normal.   4.  Right atrial size was normal.   5. The mitral valve is normal in structure. Trace mitral valve  regurgitation. No evidence of mitral stenosis.   6. The tricuspid valve is normal in structure. Tricuspid valve  regurgitation is trivial.   7. The aortic valve is normal in structure. Aortic valve regurgitation is  trivial. No evidence of aortic valve sclerosis or stenosis.   FINDINGS   Left Ventricle: Left ventricular ejection fraction, by visual estimation,  is 60 to 65%. The left ventricle has normal function. There is no left  ventricular hypertrophy. Left ventricular diastolic parameters were  normal. Normal left atrial pressure.   Right Ventricle: The right ventricular size is normal. No increase in  right ventricular wall thickness. Global RV systolic function is has  normal systolic function. The tricuspid regurgitant velocity is 2.17 m/s,  and with an assumed  right atrial pressure   of 3 mmHg, the estimated right ventricular systolic pressure is normal at  21.8 mmHg.   Left Atrium: Left atrial size was normal in size.   Right Atrium: Right atrial size was normal in size   Pericardium: There is no evidence of pericardial effusion.   Mitral Valve: The mitral valve is normal in structure. No evidence of  mitral valve stenosis by observation. Trace mitral valve regurgitation.   Tricuspid Valve: The tricuspid valve is normal in structure. Tricuspid  valve regurgitation is trivial.   Aortic Valve: The aortic valve is normal in structure. Aortic valve  regurgitation is trivial. Aortic regurgitation PHT measures 591 msec. The  aortic valve is structurally normal, with no evidence of sclerosis or  stenosis.   Pulmonic Valve: The pulmonic valve was normal in structure. Pulmonic valve  regurgitation is not visualized.   Aorta: The aortic root, ascending aorta and aortic arch are all  structurally normal, with no evidence of dilitation or obstruction.   Venous: The inferior vena  cava is normal in size with greater than 50%  respiratory variability, suggesting right atrial pressure of 3 mmHg.   IAS/Shunts: No atrial level shunt detected by color flow Doppler. No  ventricular septal defect is seen or detected. There is no evidence of an  atrial septal defect.     Recent Labs: No results found for requested labs within last 8760 hours.  Recent Lipid Panel    Component Value Date/Time   CHOL 204 (H) 02/01/2019 1034   TRIG 63 02/01/2019 1034   HDL 63 02/01/2019 1034   CHOLHDL 3.2 02/01/2019 1034   LDLCALC 126 (H) 02/01/2019 1034    Physical Exam:    VS:  BP 124/76   Pulse 81   Ht 5' 5"  (1.651 m)   Wt 203 lb (92.1 kg)   SpO2 98%   BMI 33.78 kg/m     Wt Readings from Last 3 Encounters:  09/25/20 203 lb (92.1 kg)  01/03/20 200 lb (90.7 kg)  08/12/19 204 lb (92.5 kg)     GEN: Well nourished, well developed in no acute distress HEENT: Normal NECK: No JVD; No carotid bruits LYMPHATICS: No lymphadenopathy CARDIAC: S1S2 noted,RRR, no murmurs, rubs, gallops RESPIRATORY:  Clear to auscultation without rales, wheezing or rhonchi  ABDOMEN: Soft, non-tender, non-distended, +bowel sounds, no guarding. EXTREMITIES: No edema, No cyanosis, no clubbing MUSCULOSKELETAL:  No deformity  SKIN: Warm and dry NEUROLOGIC:  Alert and oriented x 3, non-focal PSYCHIATRIC:  Normal affect, good insight  ASSESSMENT:    1. PVC (premature ventricular contraction)   2. Obesity (BMI 30-39.9)   3. Hypertension, unspecified type   4. Mixed hyperlipidemia    PLAN:     She appears to be doing well from a cardiovascular standpoint.  No medication changes will be made today.  We talked about nonpharmacological things that can help decrease her PVCs including cutting back on her caffeine.  She plans to try this.  The patient understands the need to lose weight with diet and exercise. We have discussed specific strategies for this.  Blood pressure is acceptable, continue with  current antihypertensive regimen.  Lipid profile done in October 2020 showed total cholesterol 204, triglycerides 60, HDL 63, LDL 126 plan to repeat lipid profile at next visit.  The patient is in agreement with the above plan. The patient left the office in stable condition.  The patient will follow up in 1 year   Medication Adjustments/Labs and Tests  Ordered: Current medicines are reviewed at length with the patient today.  Concerns regarding medicines are outlined above.  Orders Placed This Encounter  Procedures   EKG 12-Lead   No orders of the defined types were placed in this encounter.   Patient Instructions  Medication Instructions:  Your physician recommends that you continue on your current medications as directed. Please refer to the Current Medication list given to you today.  *If you need a refill on your cardiac medications before your next appointment, please call your pharmacy*   Lab Work: none If you have labs (blood work) drawn today and your tests are completely normal, you will receive your results only by: [place] (if you have MyChart) OR A paper copy in the mail If you have any lab test that is abnormal or we need to change your treatment, we will call you to review the results.   Testing/Procedures: none   Follow-Up: At [facility], you and your health needs are our priority.  As part of our continuing mission to provide you with exceptional heart care, we have created designated Provider Care Teams.  These Care Teams include your primary Cardiologist (physician) and Advanced Practice Providers (APPs -  Physician Assistants and Nurse Practitioners) who all work together to provide you with the care you need, when you need it.  We recommend signing up for the patient portal called "MyChart".  Sign up information is provided on this After Visit Summary.  MyChart is used to connect with patients for Virtual Visits (Telemedicine).  Patients are able  to view lab/test results, encounter notes, upcoming appointments, etc.  Non-urgent messages can be sent to your provider as well.   To learn more about what you can do with MyChart, go to [email or web address].    Your next appointment:   12 month(s)  The format for your next appointment:   In Person  Provider:   [facility], Annett Boxwell   Other Instructions    Adopting a Healthy Lifestyle.  Know what a healthy weight is for you (roughly BMI <25) and aim to maintain this   Aim for 7+ servings of fruits and vegetables daily   65-80+ fluid ounces of water or unsweet tea for healthy kidneys   Limit to max 1 drink of alcohol per day; avoid smoking/tobacco   Limit animal fats in diet for cholesterol and heart health - choose grass fed whenever available   Avoid highly processed foods, and foods high in saturated/trans fats   Aim for low stress - take time to unwind and care for your mental health   Aim for 150 min of moderate intensity exercise weekly for heart health, and weights twice weekly for bone health   Aim for 7-9 hours of sleep daily   When it comes to diets, agreement about the perfect plan isnt easy to find, even among the experts. Experts at the [place] developed an idea known as the Healthy Eating Plate. Just imagine a plate divided into logical, healthy portions.   The emphasis is on diet quality:   Load up on vegetables and fruits - one-half of your plate: Aim for color and variety, and remember that potatoes dont count.   Go for whole grains - one-quarter of your plate: Whole wheat, barley, wheat berries, quinoa, oats, brown rice, and foods made with them. If you want pasta, go with whole wheat pasta.   Protein power - one-quarter of your plate: Fish, chicken, beans, and nuts are  all healthy, versatile protein sources. Limit red meat.   The diet, however, does go beyond the plate, offering a few other suggestions.   Use healthy plant  oils, such as olive, canola, soy, corn, sunflower and peanut. Check the labels, and avoid partially hydrogenated oil, which have unhealthy trans fats.   If youre thirsty, drink water. Coffee and tea are good in moderation, but skip sugary drinks and limit milk and dairy products to one or two daily servings.   The type of carbohydrate in the diet is more important than the amount. Some sources of carbohydrates, such as vegetables, fruits, whole grains, and beans-are healthier than others.   Finally, stay active  Signed, Berniece Salines, DO  09/25/2020 12:43 PM    [facility]

## 2020-09-25 NOTE — Patient Instructions (Signed)
Medication Instructions:  Your physician recommends that you continue on your current medications as directed. Please refer to the Current Medication list given to you today.  *If you need a refill on your cardiac medications before your next appointment, please call your pharmacy*   Lab Work: none If you have labs (blood work) drawn today and your tests are completely normal, you will receive your results only by: [place] (if you have MyChart) OR A paper copy in the mail If you have any lab test that is abnormal or we need to change your treatment, we will call you to review the results.   Testing/Procedures: none   Follow-Up: At [facility], you and your health needs are our priority.  As part of our continuing mission to provide you with exceptional heart care, we have created designated Provider Care Teams.  These Care Teams include your primary Cardiologist (physician) and Advanced Practice Providers (APPs -  Physician Assistants and Nurse Practitioners) who all work together to provide you with the care you need, when you need it.  We recommend signing up for the patient portal called "MyChart".  Sign up information is provided on this After Visit Summary.  MyChart is used to connect with patients for Virtual Visits (Telemedicine).  Patients are able to view lab/test results, encounter notes, upcoming appointments, etc.  Non-urgent messages can be sent to your provider as well.   To learn more about what you can do with MyChart, go to [email or web address].    Your next appointment:   12 month(s)  The format for your next appointment:   In Person  Provider:   [facility], [place]   Other Instructions

## 2020-09-29 ENCOUNTER — Other Ambulatory Visit: Payer: Self-pay | Admitting: Cardiology

## 2020-09-29 NOTE — Telephone Encounter (Signed)
Diltiazem 180 mg # 90 x 2 refills sent to  [facility], [place] - [id number]

## 2021-02-12 ENCOUNTER — Other Ambulatory Visit: Payer: Self-pay | Admitting: Cardiology

## 2021-04-23 ENCOUNTER — Ambulatory Visit (INDEPENDENT_AMBULATORY_CARE_PROVIDER_SITE_OTHER): Payer: 59

## 2021-04-23 ENCOUNTER — Encounter: Payer: Self-pay | Admitting: Family Medicine

## 2021-04-23 ENCOUNTER — Ambulatory Visit (INDEPENDENT_AMBULATORY_CARE_PROVIDER_SITE_OTHER): Payer: 59 | Admitting: Family Medicine

## 2021-04-23 ENCOUNTER — Other Ambulatory Visit: Payer: Self-pay

## 2021-04-23 VITALS — BP 118/72 | HR 72 | Temp 97.9°F | Resp 16 | Ht 65.0 in | Wt 202.0 lb

## 2021-04-23 DIAGNOSIS — Z1211 Encounter for screening for malignant neoplasm of colon: Secondary | ICD-10-CM | POA: Diagnosis not present

## 2021-04-23 DIAGNOSIS — M25562 Pain in left knee: Secondary | ICD-10-CM | POA: Diagnosis not present

## 2021-04-23 DIAGNOSIS — M1712 Unilateral primary osteoarthritis, left knee: Secondary | ICD-10-CM | POA: Diagnosis not present

## 2021-04-23 DIAGNOSIS — L821 Other seborrheic keratosis: Secondary | ICD-10-CM | POA: Diagnosis not present

## 2021-04-23 DIAGNOSIS — Z1231 Encounter for screening mammogram for malignant neoplasm of breast: Secondary | ICD-10-CM

## 2021-04-23 DIAGNOSIS — G8929 Other chronic pain: Secondary | ICD-10-CM | POA: Diagnosis not present

## 2021-04-23 DIAGNOSIS — I493 Ventricular premature depolarization: Secondary | ICD-10-CM

## 2021-04-23 DIAGNOSIS — L918 Other hypertrophic disorders of the skin: Secondary | ICD-10-CM | POA: Diagnosis not present

## 2021-04-23 DIAGNOSIS — M7061 Trochanteric bursitis, right hip: Secondary | ICD-10-CM

## 2021-04-23 MED ORDER — DILTIAZEM HCL ER COATED BEADS 240 MG PO CP24: 240.0000 mg | ORAL_CAPSULE | Freq: Every day | ORAL | 1 refills | Status: DC

## 2021-04-23 NOTE — Progress Notes (Signed)
Acute Office Visit  Subjective:    Patient ID: Rachel Cisneros, female    DOB: [birth year], 51 y.o.   MRN: [id number]  Chief Complaint  Patient presents with   Referral    Dermatology for skin tag removal    Knee Pain    Left knee pain, No known injury, Difficulty bending down, several months    Hip Pain    Right Hip pain,for several months, No known injury, Pain radiates down right leg. Painful when sleeping.     HPI Patient is in today for   Has some skin tags.  Left knee pain, No known injury, Difficulty bending down, several months  She has had Right Hip pain,for almost a year.  She says initially she would notice it was more bothersome when she was sitting with the hip flexed for long period of time.  Now she is having pain when she tries to sleep on that side and has to get off of it.  She has started sleeping with a pillow in between her knees.  No known injury, Pain radiates down right leg. Painful when sleeping.  BiLateral  knee pain for greater than 6 months, though her left is worse than her right.  Back in August she was out camping and squatted down to do something and that left knee just gave out and hit the ground.  She said also during that same camping trip she slid on some wet leaves and her right leg went forward and the left one was bit in flexed.  She had significant pain and discomfort after that injury.  She also notes that she had an increase in her PVCs/palpitations about midcycle of her menstrual periods about 2 weeks or week before her period she says they are really ramped up she really limits caffeine intake to just 1 cup of coffee in the mornings.  She drinks plenty of water and stays hydrated.  She stays away from other stimulants like decongestants.  She has had full work-up including a cardiac monitor to confirm PVCs.  She was on atenolol and is now on Cardizem.  For the most part it works well except for right around that time of the month  Past Medical  History:  Diagnosis Date   Chronic back pain 02/02/2019   Elevated LDL cholesterol level 05/25/2019   Hiatal hernia 08/12/2019   Palpitation 02/02/2019   Palpitations    Scoliosis 02/02/2019   Skipped heart beats 02/10/2019   Stress incontinence 02/02/2019   Tachycardia     Past Surgical History:  Procedure Laterality Date   TONSILLECTOMY      Family History  Problem Relation Age of Onset   Brain cancer Mother 39   Lung cancer Maternal Grandfather 2   Sudden Cardiac Death Maternal Grandmother 6    Social History   Socioeconomic History   Marital status: Married    Spouse name: Not on file   Number of children: Not on file   Years of education: Not on file   Highest education level: Not on file  Occupational History   Not on file  Tobacco Use   Smoking status: Former    Types: Cigarettes    Quit date: 04/15/2001    Years since quitting: 20.0   Smokeless tobacco: Never  Vaping Use   Vaping Use: Never used  Substance and Sexual Activity   Alcohol use: Yes    Alcohol/week: 2.0 standard drinks    Types: 2 Standard drinks or  equivalent per week   Drug use: Never   Sexual activity: Yes    Partners: Male    Birth control/protection: I.U.D.  Other Topics Concern   Not on file  Social History Narrative   Not on file   Social Determinants of Health   Financial Resource Strain: Not on file  Food Insecurity: Not on file  Transportation Needs: Not on file  Physical Activity: Not on file  Stress: Not on file  Social Connections: Not on file  Intimate Partner Violence: Not on file    Outpatient Medications Prior to Visit  Medication Sig Dispense Refill   acyclovir (ZOVIRAX) 400 MG tablet Take 400 mg by mouth 3 (three) times daily as needed (hsv).     Misc Natural Products (OSTEO BI-FLEX ADV DOUBLE ST) TABS Take by mouth daily.     diltiazem (CARDIZEM CD) 180 MG 24 hr capsule TAKE 1 CAPSULE BY MOUTH DAILY 90 capsule 2   No facility-administered medications prior  to visit.    Allergies  Allergen Reactions   Codeine Shortness Of Breath and Swelling    Review of Systems     Objective:    Physical Exam Vitals reviewed.  Constitutional:      Appearance: She is well-developed.  HENT:     Head: Normocephalic and atraumatic.  Eyes:     Conjunctiva/sclera: Conjunctivae normal.  Cardiovascular:     Rate and Rhythm: Normal rate.  Pulmonary:     Effort: Pulmonary effort is normal.  Musculoskeletal:     Comments: Right hip with normal flexion and extension.  Tender over the greater trochanter.  No significant discomfort or pain with internal or external rotation.  Left knee with normal flexion and extension she is tender medially over the joint line.  No patellar tendon tenderness.  No crepitus.  Normal strength is 5-5 at the hip and knee.  Negative McMurray's  Skin:    General: Skin is dry.     Coloration: Skin is not pale.     Comments: Does have a skin tag underneath each eye and a few scattered seborrheic keratoses on her forehead and right dorsum of hand.  Neurological:     Mental Status: She is alert and oriented to person, place, and time.  Psychiatric:        Behavior: Behavior normal.    BP 118/72    Pulse 72    Temp 97.9 F (36.6 C)    Resp 16    Ht 5' 5"  (1.651 m)    Wt 202 lb (91.6 kg)    LMP 04/21/2021    SpO2 98%    BMI 33.61 kg/m  Wt Readings from Last 3 Encounters:  04/23/21 202 lb (91.6 kg)  09/25/20 203 lb (92.1 kg)  01/03/20 200 lb (90.7 kg)    Health Maintenance Due  Topic Date Due   HIV Screening  Never done   Hepatitis C Screening  Never done   COLONOSCOPY (Pts 45-80yr Insurance coverage will need to be confirmed)  Never done    There are no preventive care reminders to display for this patient.   Lab Results  Component Value Date   TSH 0.62 08/12/2019   Lab Results  Component Value Date   WBC 7.4 08/12/2019   HGB 14.6 08/12/2019   HCT 44.1 08/12/2019   MCV 92.6 08/12/2019   PLT 313 08/12/2019    Lab Results  Component Value Date   NA 140 08/12/2019   K 4.2 08/12/2019  CO2 25 08/12/2019   GLUCOSE 98 08/12/2019   BUN 10 08/12/2019   CREATININE 0.78 08/12/2019   BILITOT 0.5 08/12/2019   AST 13 08/12/2019   ALT 11 08/12/2019   PROT 6.7 08/12/2019   CALCIUM 9.2 08/12/2019   Lab Results  Component Value Date   CHOL 204 (H) 02/01/2019   Lab Results  Component Value Date   HDL 63 02/01/2019   Lab Results  Component Value Date   LDLCALC 126 (H) 02/01/2019   Lab Results  Component Value Date   TRIG 63 02/01/2019   Lab Results  Component Value Date   CHOLHDL 3.2 02/01/2019   No results found for: HGBA1C     Assessment & Plan:   Problem List Items Addressed This Visit       Cardiovascular and Mediastinum   PVC's (premature ventricular contractions)    We did discuss possibly increasing the Cardizem to see if this better controls the time to midcycle.  Or could even consider birth control as an option to help regulate the hormones which certainly could be contributing her symptoms since it seems to be fairly cyclic.  She would like to try going up on the Cardizem first and see if that is helpful.  New prescription sent to pharmacy.  Call if any problems or concerns.      Relevant Medications   diltiazem (CARDIZEM CD) 240 MG 24 hr capsule   Other Visit Diagnoses     Colon cancer screening    -  Primary   Relevant Orders   Ambulatory referral to Gastroenterology   Encounter for screening mammogram for malignant neoplasm of breast       Relevant Orders   MM Digital Screening   Acute pain of left knee       Relevant Orders   DG Knee Complete 4 Views Left   Trochanteric bursitis of right hip       Skin tag       Seborrheic keratoses          Left knee pain-discussed further work-up I am concerned that she may actually have a little bit of a cartilage tear she is very tender medially so we will start with plain film x-ray and call with results  next.  Right trochanteric bursitis-recommend physical therapy given handout with stretches to do on her own at home if not improving over the next month then please let me know  Colon cancer screening-order placed for referral for colonoscopy.  Mammogram due-order placed today.  Okay to place dermatology referral she like to have a full skin check she does have some seborrheic keratoses we discussed that these are often treated with cryotherapy and she does have a couple skin tags that she would like to have removed as well that can be done here or at dermatology which ever is her preference.  Meds ordered this encounter  Medications   diltiazem (CARDIZEM CD) 240 MG 24 hr capsule    Sig: Take 1 capsule (240 mg total) by mouth daily.    Dispense:  30 capsule    Refill:  1     Beatrice Lecher, MD

## 2021-04-23 NOTE — Assessment & Plan Note (Signed)
We did discuss possibly increasing the Cardizem to see if this better controls the time to midcycle.  Or could even consider birth control as an option to help regulate the hormones which certainly could be contributing her symptoms since it seems to be fairly cyclic.  She would like to try going up on the Cardizem first and see if that is helpful.  New prescription sent to pharmacy.  Call if any problems or concerns.

## 2021-04-24 NOTE — Progress Notes (Signed)
Hi Sindi, I just wanted to let you know that your knee x-ray shows some arthritis and degeneration in the medial compartment which is more towards the inner part of your knee and also behind the kneecap which is called the patellofemoral compartment.  There is a little bit of fluid in the bursa sac that sits in front of the kneecap as well.  I still like to get you in with our sports med doc for that knee if you are okay with that then we will schedule you with Dr. Dianah Field here in our office.

## 2021-05-01 ENCOUNTER — Other Ambulatory Visit: Payer: Self-pay

## 2021-05-01 ENCOUNTER — Ambulatory Visit (INDEPENDENT_AMBULATORY_CARE_PROVIDER_SITE_OTHER): Payer: 59 | Admitting: Sports Medicine

## 2021-05-01 ENCOUNTER — Ambulatory Visit (INDEPENDENT_AMBULATORY_CARE_PROVIDER_SITE_OTHER): Payer: 59

## 2021-05-01 DIAGNOSIS — M7061 Trochanteric bursitis, right hip: Secondary | ICD-10-CM

## 2021-05-01 DIAGNOSIS — M19011 Primary osteoarthritis, right shoulder: Secondary | ICD-10-CM | POA: Diagnosis not present

## 2021-05-01 DIAGNOSIS — M1712 Unilateral primary osteoarthritis, left knee: Secondary | ICD-10-CM | POA: Diagnosis not present

## 2021-05-01 DIAGNOSIS — M25551 Pain in right hip: Secondary | ICD-10-CM | POA: Diagnosis not present

## 2021-05-01 DIAGNOSIS — M7541 Impingement syndrome of right shoulder: Secondary | ICD-10-CM

## 2021-05-01 DIAGNOSIS — M1611 Unilateral primary osteoarthritis, right hip: Secondary | ICD-10-CM | POA: Insufficient documentation

## 2021-05-01 MED ORDER — MELOXICAM 15 MG PO TABS: ORAL_TABLET | ORAL | 3 refills | Status: DC

## 2021-05-01 NOTE — Assessment & Plan Note (Signed)
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 She saw her PCP, x-rays showed some arthritis, she is doing some over-the-counter NSAIDs without much improvement. Osteo Bi-Flex also not helping. We will get a little more aggressive, adding meloxicam, formal physical therapy. Return to see me in 6 weeks, injection if no better.

## 2021-05-01 NOTE — Progress Notes (Signed)
° ° °  Procedures performed today:    None.  Independent interpretation of notes and tests performed by another provider:   None.  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 She saw her PCP, x-rays showed some arthritis, she is doing some over-the-counter NSAIDs without much improvement. Osteo Bi-Flex also not helping. We will get a little more aggressive, adding meloxicam, formal physical therapy. Return to see me in 6 weeks, injection if no better.  Trochanteric bursitis, right hip We will start conservatively, hip abductor conditioning, x-rays, meloxicam for Return to see me in 6 weeks, consider injection if hip abductors are strong and she is not feeling much better.  Impingement syndrome, shoulder, right Pain at night, over the deltoid, worse with abduction. Impingement signs on exam. Adding x-rays, formal physical therapy, we did discuss the anatomy and force coupling function of the rotator cuff. Return in 6 weeks, subacromial injection if not better.    ___________________________________________ Gwen Her. Dianah Field, M.D., ABFM., CAQSM. Primary Care and [place] Instructor of [place] of [facility] [employer]

## 2021-05-01 NOTE — Assessment & Plan Note (Signed)
We will start conservatively, hip abductor conditioning, x-rays, meloxicam for Return to see me in 6 weeks, consider injection if hip abductors are strong and she is not feeling much better.

## 2021-05-01 NOTE — Assessment & Plan Note (Signed)
Pain at night, over the deltoid, worse with abduction. Impingement signs on exam. Adding x-rays, formal physical therapy, we did discuss the anatomy and force coupling function of the rotator cuff. Return in 6 weeks, subacromial injection if not better.

## 2021-05-01 NOTE — Addendum Note (Signed)
Addended by: Silverio Decamp on: 05/01/2021 10:38 AM   Modules accepted: Orders

## 2021-05-02 ENCOUNTER — Ambulatory Visit (INDEPENDENT_AMBULATORY_CARE_PROVIDER_SITE_OTHER): Payer: 59

## 2021-05-02 ENCOUNTER — Encounter: Payer: Self-pay | Admitting: Sports Medicine

## 2021-05-02 DIAGNOSIS — Z1231 Encounter for screening mammogram for malignant neoplasm of breast: Secondary | ICD-10-CM | POA: Diagnosis not present

## 2021-05-02 NOTE — Progress Notes (Signed)
Hi Rachel Cisneros, on your mammogram they recommended some additional evaluation of the left breast.  They saw some calcifications.  The imaging department should be contacting you soon to get additional imaging scheduled.

## 2021-05-03 ENCOUNTER — Other Ambulatory Visit: Payer: Self-pay | Admitting: Nurse Practitioner

## 2021-05-03 ENCOUNTER — Other Ambulatory Visit: Payer: Self-pay | Admitting: Family Medicine

## 2021-05-03 DIAGNOSIS — R928 Other abnormal and inconclusive findings on diagnostic imaging of breast: Secondary | ICD-10-CM

## 2021-05-22 ENCOUNTER — Other Ambulatory Visit: Payer: Self-pay

## 2021-05-22 ENCOUNTER — Ambulatory Visit
Admission: RE | Admit: 2021-05-22 | Discharge: 2021-05-22 | Disposition: A | Discharge status: Home / Self Care | Payer: 59 | Source: Ambulatory Visit | Attending: Family Medicine | Admitting: Family Medicine

## 2021-05-22 DIAGNOSIS — R922 Inconclusive mammogram: Secondary | ICD-10-CM | POA: Diagnosis not present

## 2021-05-22 DIAGNOSIS — R928 Other abnormal and inconclusive findings on diagnostic imaging of breast: Secondary | ICD-10-CM

## 2021-05-24 ENCOUNTER — Other Ambulatory Visit: Payer: Self-pay | Admitting: Family Medicine

## 2021-06-05 ENCOUNTER — Ambulatory Visit: Payer: 59 | Admitting: Family Medicine

## 2021-06-12 ENCOUNTER — Ambulatory Visit (INDEPENDENT_AMBULATORY_CARE_PROVIDER_SITE_OTHER): Payer: 59

## 2021-06-12 ENCOUNTER — Ambulatory Visit (INDEPENDENT_AMBULATORY_CARE_PROVIDER_SITE_OTHER): Payer: 59 | Admitting: Sports Medicine

## 2021-06-12 ENCOUNTER — Other Ambulatory Visit: Payer: Self-pay

## 2021-06-12 DIAGNOSIS — M7061 Trochanteric bursitis, right hip: Secondary | ICD-10-CM

## 2021-06-12 DIAGNOSIS — M1712 Unilateral primary osteoarthritis, left knee: Secondary | ICD-10-CM

## 2021-06-12 DIAGNOSIS — M7541 Impingement syndrome of right shoulder: Secondary | ICD-10-CM

## 2021-06-12 NOTE — Assessment & Plan Note (Signed)
Continued knee pain with failure of conservative treatment, injection today, return to see me in 6 weeks. Viscosupplementation if not better.

## 2021-06-12 NOTE — Assessment & Plan Note (Signed)
Strength improved but still painful over the greater trochanter, injected today, return to see me in 6 weeks.

## 2021-06-12 NOTE — Assessment & Plan Note (Signed)
Improved with conservative treatment.

## 2021-06-12 NOTE — Progress Notes (Signed)
° ° °  Procedures performed today:    Procedure: Real-time Ultrasound Guided injection of the right greater trochanteric bursa Device: Samsung HS60  Verbal informed consent obtained.  Time-out conducted.  Noted no overlying erythema, induration, or other signs of local infection.  Skin prepped in a sterile fashion.  Local anesthesia: Topical Ethyl chloride.  With sterile technique and under real time ultrasound guidance: Noted normal-appearing bursa, 1 cc Kenalog 40, 2 cc lidocaine, 2 cc bupivacaine injected easily Completed without difficulty  Advised to call if fevers/chills, erythema, induration, drainage, or persistent bleeding.  Images permanently stored and available for review in PACS.  Impression: Technically successful ultrasound guided injection.  Procedure: Real-time Ultrasound Guided injection of the left knee Device: Samsung HS60  Verbal informed consent obtained.  Time-out conducted.  Noted no overlying erythema, induration, or other signs of local infection.  Skin prepped in a sterile fashion.  Local anesthesia: Topical Ethyl chloride.  With sterile technique and under real time ultrasound guidance: Noted trace effusion, 1 cc Kenalog 40, 2 cc lidocaine, 2 cc bupivacaine injected easily Completed without difficulty  Advised to call if fevers/chills, erythema, induration, drainage, or persistent bleeding.  Images permanently stored and available for review in PACS.  Impression: Technically successful ultrasound guided injection.  Independent interpretation of notes and tests performed by another provider:   None.  Brief History, Exam, Impression, and Recommendations:    Impingement syndrome, shoulder, right Improved with conservative treatment.  Primary osteoarthritis of left knee Continued knee pain with failure of conservative treatment, injection today, return to see me in 6 weeks. Viscosupplementation if not better.  Trochanteric bursitis, right hip Strength  improved but still painful over the greater trochanter, injected today, return to see me in 6 weeks.    ___________________________________________ Gwen Her. Dianah Field, M.D., ABFM., CAQSM. Primary Care and [place] Instructor of [place] of [facility]

## 2021-06-21 ENCOUNTER — Ambulatory Visit (INDEPENDENT_AMBULATORY_CARE_PROVIDER_SITE_OTHER): Payer: 59 | Admitting: Family Medicine

## 2021-06-21 ENCOUNTER — Encounter: Payer: Self-pay | Admitting: Family Medicine

## 2021-06-21 ENCOUNTER — Other Ambulatory Visit: Payer: Self-pay

## 2021-06-21 VITALS — BP 115/75 | HR 71 | Resp 18 | Ht 65.0 in | Wt 194.0 lb

## 2021-06-21 DIAGNOSIS — I493 Ventricular premature depolarization: Secondary | ICD-10-CM

## 2021-06-21 DIAGNOSIS — L918 Other hypertrophic disorders of the skin: Secondary | ICD-10-CM

## 2021-06-21 DIAGNOSIS — Z13220 Encounter for screening for lipoid disorders: Secondary | ICD-10-CM | POA: Diagnosis not present

## 2021-06-21 DIAGNOSIS — L570 Actinic keratosis: Secondary | ICD-10-CM | POA: Diagnosis not present

## 2021-06-21 DIAGNOSIS — Z1211 Encounter for screening for malignant neoplasm of colon: Secondary | ICD-10-CM | POA: Diagnosis not present

## 2021-06-21 MED ORDER — DILTIAZEM HCL ER COATED BEADS 240 MG PO CP24: 240.0000 mg | ORAL_CAPSULE | Freq: Every day | ORAL | 1 refills | Status: DC

## 2021-06-21 NOTE — Assessment & Plan Note (Signed)
Discussed options.  Will  Continue Cardizem.  Will get up date labs to rule out electrolyte abnormality.Marland Kitchen   ?

## 2021-06-21 NOTE — Progress Notes (Signed)
? ?Established Patient Office Visit ? ?Subjective:  ?Patient ID: Rachel Cisneros, female    DOB: April 14, 1971  Age: 51 y.o. MRN: [id number] ? ?CC:  ?Chief Complaint  ?Patient presents with  ? Follow-up  ?  PVC's   ? skin tags  ?  Discuss referral to dermatology for removal   ? ? ?HPI ?Rachel Cisneros presents for follow-up PVCs.  She is currently on diltiazem 240 mg a day to help control the PVCs.  We increase the Cardizem dose to 21m aobut 2 months ago.  For the first month, she said she was having almost an increase in the PVCs she is it was really strange and unusual.  There were no major changes to diet or caffeine intake she was not taking any decongestants.  She was not having any sleep deprivation or increased stress or anxiety.  She said when she picked up the second prescription for the second month it seemed to settle down and improved.  So so far she does feel like its been helpful she still having some intermittent symptoms but they are not as frequent and not as intense.  She is not feeling lightheaded or dizzy. ? ?He does drink over 100 ounces of water a day. ? ?Also has some skin tags around her eyes and a red lesion on the dorsum of her right hand she would like me to look at today. ? ?Past Medical History:  ?Diagnosis Date  ? Chronic back pain 02/02/2019  ? Elevated LDL cholesterol level 05/25/2019  ? Hiatal hernia 08/12/2019  ? Palpitation 02/02/2019  ? Palpitations   ? Scoliosis 02/02/2019  ? Skipped heart beats 02/10/2019  ? Stress incontinence 02/02/2019  ? Tachycardia   ? ? ?Past Surgical History:  ?Procedure Laterality Date  ? TONSILLECTOMY    ? ? ?Family History  ?Problem Relation Age of Onset  ? Brain cancer Mother 563 ? Lung cancer Maternal Grandfather 751 ? Sudden Cardiac Death Maternal Grandmother 620 ? ? ?Social History  ? ?Socioeconomic History  ? Marital status: Married  ?  Spouse name: Not on file  ? Number of children: Not on file  ? Years of education: Not on file  ? Highest education  level: Not on file  ?Occupational History  ? Not on file  ?Tobacco Use  ? Smoking status: Former  ?  Types: Cigarettes  ?  Quit date: 04/15/2001  ?  Years since quitting: 20.1  ? Smokeless tobacco: Never  ?Vaping Use  ? Vaping Use: Never used  ?Substance and Sexual Activity  ? Alcohol use: Yes  ?  Alcohol/week: 2.0 standard drinks  ?  Types: 2 Standard drinks or equivalent per week  ? Drug use: Never  ? Sexual activity: Yes  ?  Partners: Male  ?  Birth control/protection: I.U.D.  ?Other Topics Concern  ? Not on file  ?Social History Narrative  ? Not on file  ? ?Social Determinants of Health  ? ?Financial Resource Strain: Not on file  ?Food Insecurity: Not on file  ?Transportation Needs: Not on file  ?Physical Activity: Not on file  ?Stress: Not on file  ?Social Connections: Not on file  ?Intimate Partner Violence: Not on file  ? ? ?Outpatient Medications Prior to Visit  ?Medication Sig Dispense Refill  ? acyclovir (ZOVIRAX) 400 MG tablet Take 400 mg by mouth 3 (three) times daily as needed (hsv).    ? meloxicam (MOBIC) 15 MG tablet One tab PO qAM with  a meal for 2 weeks, then daily prn pain. 30 tablet 3  ? Misc Natural Products (OSTEO BI-FLEX ADV DOUBLE ST) TABS Take by mouth daily.    ? diltiazem (CARDIZEM CD) 240 MG 24 hr capsule TAKE 1 CAPSULE BY MOUTH DAILY 30 capsule 1  ? ?No facility-administered medications prior to visit.  ? ? ?Allergies  ?Allergen Reactions  ? Codeine Shortness Of Breath and Swelling  ? ? ?ROS ?Review of Systems ? ?  ?Objective:  ?  ?Physical Exam ?Constitutional:   ?   Appearance: Normal appearance. She is well-developed.  ?HENT:  ?   Head: Normocephalic and atraumatic.  ?Cardiovascular:  ?   Rate and Rhythm: Normal rate and regular rhythm.  ?   Heart sounds: Normal heart sounds.  ?Pulmonary:  ?   Effort: Pulmonary effort is normal.  ?   Breath sounds: Normal breath sounds.  ?Skin: ?   General: Skin is warm and dry.  ?Neurological:  ?   Mental Status: She is alert and oriented to person,  place, and time.  ?Psychiatric:     ?   Behavior: Behavior normal.  ? ? ?BP 115/75   Pulse 71   Resp 18   Ht 5' 5"  (1.651 m)   Wt 194 lb (88 kg)   LMP 06/18/2021   SpO2 99%   BMI 32.28 kg/m?  ?Wt Readings from Last 3 Encounters:  ?06/21/21 194 lb (88 kg)  ?04/23/21 202 lb (91.6 kg)  ?09/25/20 203 lb (92.1 kg)  ? ? ? ?Health Maintenance Due  ?Topic Date Due  ? COVID-19 Vaccine (1) Never done  ? HIV Screening  Never done  ? Hepatitis C Screening  Never done  ? COLONOSCOPY (Pts 45-12yr Insurance coverage will need to be confirmed)  Never done  ? ? ?There are no preventive care reminders to display for this patient. ? ?Lab Results  ?Component Value Date  ? TSH 0.62 08/12/2019  ? ?Lab Results  ?Component Value Date  ? WBC 7.4 08/12/2019  ? HGB 14.6 08/12/2019  ? HCT 44.1 08/12/2019  ? MCV 92.6 08/12/2019  ? PLT 313 08/12/2019  ? ?Lab Results  ?Component Value Date  ? NA 140 08/12/2019  ? K 4.2 08/12/2019  ? CO2 25 08/12/2019  ? GLUCOSE 98 08/12/2019  ? BUN 10 08/12/2019  ? CREATININE 0.78 08/12/2019  ? BILITOT 0.5 08/12/2019  ? AST 13 08/12/2019  ? ALT 11 08/12/2019  ? PROT 6.7 08/12/2019  ? CALCIUM 9.2 08/12/2019  ? ?Lab Results  ?Component Value Date  ? CHOL 204 (H) 02/01/2019  ? ?Lab Results  ?Component Value Date  ? HDL 63 02/01/2019  ? ?Lab Results  ?Component Value Date  ? L[place]126 (H) 02/01/2019  ? ?Lab Results  ?Component Value Date  ? TRIG 63 02/01/2019  ? ?Lab Results  ?Component Value Date  ? CHOLHDL 3.2 02/01/2019  ? ?No results found for: HGBA1C ? ?  ?Assessment & Plan:  ? ?Problem List Items Addressed This Visit   ? ?  ? Cardiovascular and Mediastinum  ? PVC's (premature ventricular contractions) - Primary  ?  Discussed options.  Will  Continue Cardizem.  Will get up date labs to rule out electrolyte abnormality..   ?  ?  ? Relevant Medications  ? diltiazem (CARDIZEM CD) 240 MG 24 hr capsule  ? Other Relevant Orders  ? Lipid Panel w/reflex Direct LDL  ? COMPLETE METABOLIC PANEL WITH GFR  ? CBC  ?  TSH  ? ?  Other Visit Diagnoses   ? ? Colon cancer screening      ? Relevant Orders  ? Cologuard  ? Screening, lipid      ? Relevant Orders  ? Lipid Panel w/reflex Direct LDL  ? Skin tag      ? Actinic keratosis      ? ?  ? ?She does have some skin tags around her eyes.  1 under the right eye and 1 under the left eye.  I am happy to remove those at her convenience.  She also has a red spot on the dorsum of her right hand most consistent with an actinic keratosis.  I am happy to either do a shave biopsy or cryotherapy on that.  Again she can schedule at her convenience. ? ?Did encourage her to cut back a little bit on her water intake to closer to 80 ounces a day and less she is sweating a lot we did discuss that excess water intake can sometimes affect sodium levels. ? ?Skin tags and possible actinic keratosis-discussed treatment options.  She can schedule at her convenience ? ?Meds ordered this encounter  ?Medications  ? diltiazem (CARDIZEM CD) 240 MG 24 hr capsule  ?  Sig: Take 1 capsule (240 mg total) by mouth daily.  ?  Dispense:  90 capsule  ?  Refill:  1  ? ? ?Follow-up: Return in about 6 months (around 12/22/2021) for PVCs.  ? ? ?Beatrice Lecher, MD ?

## 2021-06-29 DIAGNOSIS — Z13220 Encounter for screening for lipoid disorders: Secondary | ICD-10-CM | POA: Diagnosis not present

## 2021-06-29 DIAGNOSIS — I493 Ventricular premature depolarization: Secondary | ICD-10-CM | POA: Diagnosis not present

## 2021-06-29 DIAGNOSIS — E7800 Pure hypercholesterolemia, unspecified: Secondary | ICD-10-CM | POA: Diagnosis not present

## 2021-06-30 LAB — COMPLETE METABOLIC PANEL WITH GFR
AG Ratio: 2 (calc) (ref 1.0–2.5)
ALT: 25 U/L (ref 6–29)
AST: 14 U/L (ref 10–35)
Albumin: 3.9 g/dL (ref 3.6–5.1)
Alkaline phosphatase (APISO): 47 U/L (ref 31–125)
BUN: 9 mg/dL (ref 7–25)
CO2: 28 mmol/L (ref 20–32)
Calcium: 8.8 mg/dL (ref 8.6–10.2)
Chloride: 104 mmol/L (ref 98–110)
Creat: 0.68 mg/dL (ref 0.50–0.99)
Globulin: 2 g/dL (calc) (ref 1.9–3.7)
Glucose, Bld: 81 mg/dL (ref 65–99)
Potassium: 4.7 mmol/L (ref 3.5–5.3)
Sodium: 141 mmol/L (ref 135–146)
Total Bilirubin: 0.7 mg/dL (ref 0.2–1.2)
Total Protein: 5.9 g/dL — ABNORMAL LOW (ref 6.1–8.1)
eGFR: 107 mL/min/{1.73_m2} (ref >=60)

## 2021-06-30 LAB — CBC
HCT: 42.3 % (ref 35.0–45.0)
Hemoglobin: 14 g/dL (ref 11.7–15.5)
MCH: 30 pg (ref 27.0–33.0)
MCHC: 33.1 g/dL (ref 32.0–36.0)
MCV: 90.6 fL (ref 80.0–100.0)
MPV: 9.7 fL (ref 7.5–12.5)
Platelets: 325 10*3/uL (ref 140–400)
RBC: 4.67 10*6/uL (ref 3.80–5.10)
RDW: 12 % (ref 11.0–15.0)
WBC: 8.2 10*3/uL (ref 3.8–10.8)

## 2021-06-30 LAB — LIPID PANEL W/REFLEX DIRECT LDL
Cholesterol: 192 mg/dL (ref <200)
HDL: 55 mg/dL (ref >=50)
LDL Cholesterol (Calc): 119 mg/dL (calc) — ABNORMAL HIGH
Non-HDL Cholesterol (Calc): 137 mg/dL (calc) — ABNORMAL HIGH (ref <130)
Total CHOL/HDL Ratio: 3.5 (calc) (ref <5.0)
Triglycerides: 82 mg/dL (ref <150)

## 2021-06-30 LAB — TSH: TSH: 0.95 mIU/L

## 2021-07-02 NOTE — Progress Notes (Signed)
Rachel Cisneros, LDL cholesterol is mildly elevated but looks better compared to 2 years ago.  About panel overall looks good.  Protein level was down just a little bit.  Just make sure you are getting enough protein in your diet.  Thyroid and blood count are normal.

## 2021-07-03 DIAGNOSIS — Z1211 Encounter for screening for malignant neoplasm of colon: Secondary | ICD-10-CM | POA: Diagnosis not present

## 2021-07-12 LAB — COLOGUARD: COLOGUARD: NEGATIVE

## 2021-07-13 NOTE — Progress Notes (Signed)
Great news! Your Cologuard test is negative.  Recommend repeat colon cancer screening in 3 years.

## 2021-07-31 ENCOUNTER — Ambulatory Visit: Payer: 59 | Admitting: Sports Medicine

## 2021-09-23 IMAGING — DX DG CHEST 2V
2 series · 2 of 2 positions shown · non-contrast
Comparison: Radiograph 08/23/2014

CLINICAL DATA: Chest pain. Left-sided chest pain onset this
morning.

EXAM:
CHEST - 2 VIEW

[chest pa]
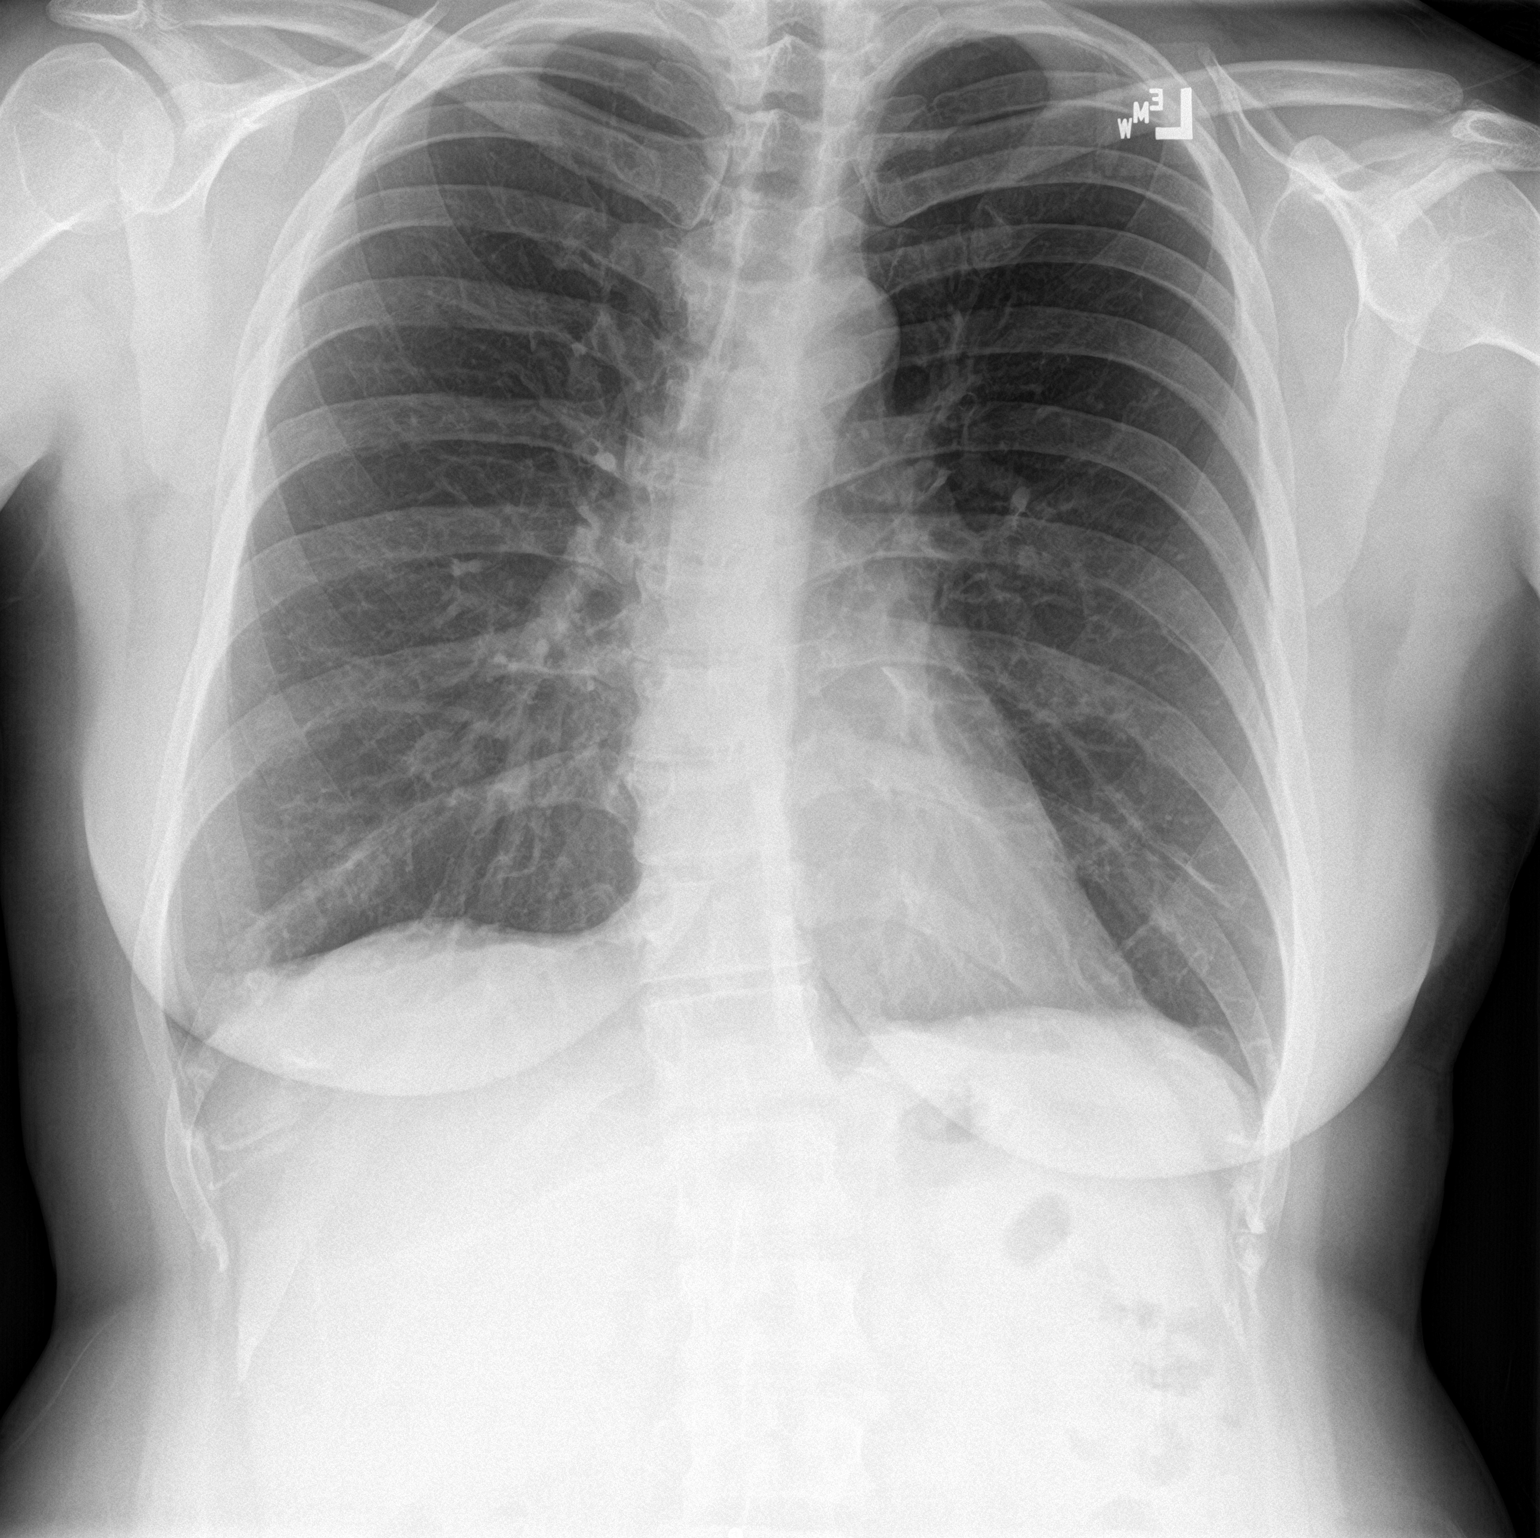

[chest lat]
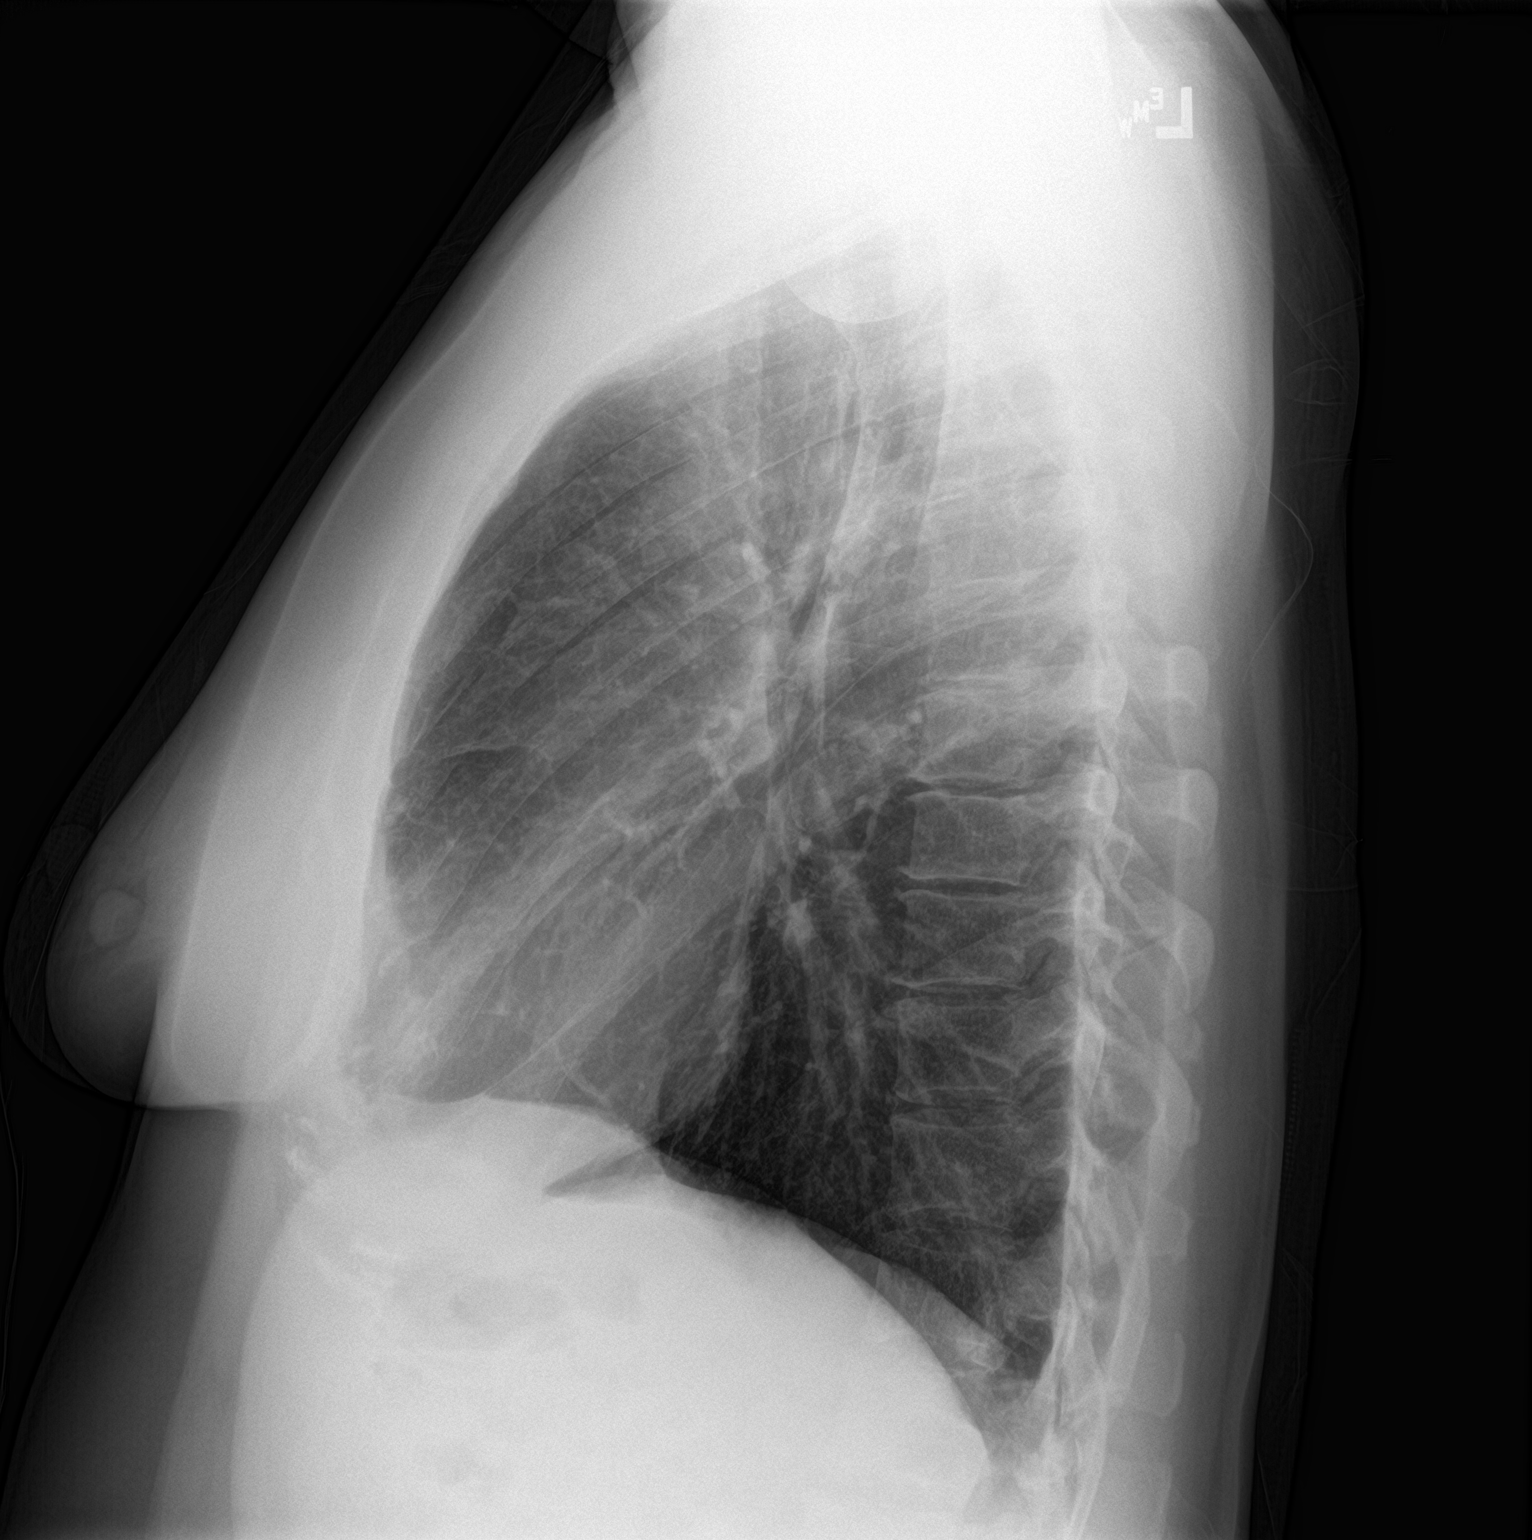

[2 of 2 positions shown; findings below may reference images not displayed]

FINDINGS: The cardiomediastinal contours are normal. The lungs are clear.
Pulmonary vasculature is normal. No consolidation, pleural effusion,
or pneumothorax. Broad-based rightward curvature of the thoracic
spine. No acute osseous abnormalities are seen.
IMPRESSION: Other than mild scoliosis, unremarkable radiographs of the chest.

## 2021-12-07 ENCOUNTER — Ambulatory Visit (INDEPENDENT_AMBULATORY_CARE_PROVIDER_SITE_OTHER): Payer: 59

## 2021-12-07 ENCOUNTER — Ambulatory Visit (INDEPENDENT_AMBULATORY_CARE_PROVIDER_SITE_OTHER): Payer: 59 | Admitting: Sports Medicine

## 2021-12-07 DIAGNOSIS — M7061 Trochanteric bursitis, right hip: Secondary | ICD-10-CM | POA: Diagnosis not present

## 2021-12-07 DIAGNOSIS — M7541 Impingement syndrome of right shoulder: Secondary | ICD-10-CM | POA: Diagnosis not present

## 2021-12-07 DIAGNOSIS — M2578 Osteophyte, vertebrae: Secondary | ICD-10-CM | POA: Diagnosis not present

## 2021-12-07 DIAGNOSIS — M25511 Pain in right shoulder: Secondary | ICD-10-CM

## 2021-12-07 DIAGNOSIS — M1712 Unilateral primary osteoarthritis, left knee: Secondary | ICD-10-CM

## 2021-12-07 DIAGNOSIS — M542 Cervicalgia: Secondary | ICD-10-CM | POA: Diagnosis not present

## 2021-12-07 DIAGNOSIS — M47812 Spondylosis without myelopathy or radiculopathy, cervical region: Secondary | ICD-10-CM | POA: Diagnosis not present

## 2021-12-07 DIAGNOSIS — M4802 Spinal stenosis, cervical region: Secondary | ICD-10-CM | POA: Diagnosis not present

## 2021-12-07 MED ORDER — TRIAZOLAM 0.25 MG PO TABS: ORAL_TABLET | ORAL | 0 refills | Status: DC

## 2021-12-07 NOTE — Progress Notes (Addendum)
    Procedures performed today:    None.  Independent interpretation of notes and tests performed by another provider:   None.  Brief History, Exam, Impression, and Recommendations:    Impingement syndrome, shoulder, right Persistent pain right shoulder, initially improved with meloxicam. I would like to investigate her neck as well, adding neck x-rays, cervical spine conditioning. If insufficient improvement we will consider MRI of the shoulder and intervention afterwards.  Primary osteoarthritis of left knee Persistent knee pain in spite of injection at the last visit, medial joint line, negative McMurray sign, question meniscal injury, MRI for arthroscopy planning, triazolam for preprocedural anxiolysis.  Update: MRI shows complex meniscal tearing and osteoarthritis, I would like Dr. Luiz Blare to weigh in regarding surgical intervention considering failure of injection.  Trochanteric bursitis, right hip Much better after injection, strength is excellent today, return as needed for this.    ____________________________________________ Ihor Austin. Benjamin Stain, M.D., ABFM., CAQSM., AME. Primary Care and Sports Medicine [facility]  Adjunct Professor of Family Medicine  [employer]  Restaurant manager, fast food

## 2021-12-07 NOTE — Assessment & Plan Note (Signed)
Much better after injection, strength is excellent today, return as needed for this.

## 2021-12-07 NOTE — Assessment & Plan Note (Signed)
Persistent pain right shoulder, initially improved with meloxicam. I would like to investigate her neck as well, adding neck x-rays, cervical spine conditioning. If insufficient improvement we will consider MRI of the shoulder and intervention afterwards.

## 2021-12-07 NOTE — Assessment & Plan Note (Addendum)
Persistent knee pain in spite of injection at the last visit, medial joint line, negative McMurray sign, question meniscal injury, MRI for arthroscopy planning, triazolam for preprocedural anxiolysis.  Update: MRI shows complex meniscal tearing and osteoarthritis, I would like Dr. Luiz Blare to weigh in regarding surgical intervention considering failure of injection.

## 2021-12-18 ENCOUNTER — Ambulatory Visit (INDEPENDENT_AMBULATORY_CARE_PROVIDER_SITE_OTHER): Payer: 59

## 2021-12-18 DIAGNOSIS — M1712 Unilateral primary osteoarthritis, left knee: Secondary | ICD-10-CM | POA: Diagnosis not present

## 2021-12-18 DIAGNOSIS — M25562 Pain in left knee: Secondary | ICD-10-CM | POA: Diagnosis not present

## 2021-12-25 ENCOUNTER — Encounter: Payer: Self-pay | Admitting: Sports Medicine

## 2021-12-26 NOTE — Addendum Note (Signed)
Addended by: Monica Becton on: 12/26/2021 03:39 PM   Modules accepted: Orders

## 2022-01-03 DIAGNOSIS — M25562 Pain in left knee: Secondary | ICD-10-CM | POA: Diagnosis not present

## 2022-01-03 DIAGNOSIS — S83242A Other tear of medial meniscus, current injury, left knee, initial encounter: Secondary | ICD-10-CM | POA: Diagnosis not present

## 2022-01-03 DIAGNOSIS — M1712 Unilateral primary osteoarthritis, left knee: Secondary | ICD-10-CM | POA: Diagnosis not present

## 2022-01-08 ENCOUNTER — Telehealth: Payer: Self-pay | Admitting: Cardiology

## 2022-01-08 NOTE — Telephone Encounter (Signed)
Patient would like to transfer from Dr. Harriet Masson to Dr. Bettina Gavia due to patient residing in [facility]. Please confirm transfer

## 2022-01-17 ENCOUNTER — Ambulatory Visit: Payer: 59 | Attending: Cardiology | Admitting: Nurse Practitioner

## 2022-01-17 ENCOUNTER — Encounter: Payer: Self-pay | Admitting: Nurse Practitioner

## 2022-01-17 VITALS — BP 112/64 | HR 90 | Wt 208.6 lb

## 2022-01-17 DIAGNOSIS — E782 Mixed hyperlipidemia: Secondary | ICD-10-CM

## 2022-01-17 DIAGNOSIS — I4729 Other ventricular tachycardia: Secondary | ICD-10-CM | POA: Diagnosis not present

## 2022-01-17 DIAGNOSIS — I493 Ventricular premature depolarization: Secondary | ICD-10-CM

## 2022-01-17 DIAGNOSIS — I10 Essential (primary) hypertension: Secondary | ICD-10-CM | POA: Diagnosis not present

## 2022-01-17 DIAGNOSIS — E669 Obesity, unspecified: Secondary | ICD-10-CM

## 2022-01-17 DIAGNOSIS — Z01810 Encounter for preprocedural cardiovascular examination: Secondary | ICD-10-CM

## 2022-01-17 MED ORDER — DILTIAZEM HCL ER COATED BEADS 240 MG PO CP24: 240.0000 mg | ORAL_CAPSULE | Freq: Every day | ORAL | 3 refills | Status: DC

## 2022-01-17 NOTE — Progress Notes (Signed)
Patient Name: [name] Date of Encounter: 01/17/2022  Primary Care Provider:  Hali Marry, MD Primary Cardiologist:  Berniece Salines, DO  Chief Complaint    51 year old female with a history of PVCs, NSVT, hypertension, hyperlipidemia, and obesity who presents for preoperative cardiac evaluation.  Past Medical History    Past Medical History:  Diagnosis Date   Chronic back pain 02/02/2019   Elevated LDL cholesterol level 05/25/2019   Hiatal hernia 08/12/2019   Palpitation 02/02/2019   Palpitations    Scoliosis 02/02/2019   Skipped heart beats 02/10/2019   Stress incontinence 02/02/2019   Tachycardia    Past Surgical History:  Procedure Laterality Date   TONSILLECTOMY      Allergies  Allergies  Allergen Reactions   Codeine Shortness Of Breath and Swelling    History of Present Illness    51 year old female with the above past medical history including PVCs, NSVT, hypertension, hyperlipidemia, and obesity.  Outpatient cardiac monitor in 03/2019 in the setting of palpitations showed baseline normal sinus rhythm, infrequent PACs, 2 runs of ventricular tachycardia.  Echocardiogram in November 2020 showed EF 60 to 65%, normal LV function, normal RV systolic function, no significant valvular abnormalities. Coronary CTA in 04/2019 showed coronary calcium score of 0, no evidence of CAD.  She was last seen in the office on 09/25/2020 and was stable from a cardiac standpoint.  She presents today for follow-up accompanied by her husband and for preoperative cardiac evaluation for upcoming left knee arthroscopy for the repair of a torn meniscus with Dr. Dorna Leitz of [facility]. Since her last visit she has done well from a cardiac standpoint.  Her diltiazem was increased to 240 mg daily earlier this year.  She reports intermittent fleeting palpitations, denies any significant palpitations, dizziness, presyncope, syncope, denies symptoms concerning for angina.   Her physical activity has been somewhat limited in the setting of knee pain, otherwise, she remains active.  She is eager to have her knee surgery.  Otherwise, she reports feeling well denies any additional concerns today.  Home Medications    Current Outpatient Medications  Medication Sig Dispense Refill   acyclovir (ZOVIRAX) 400 MG tablet Take 400 mg by mouth 3 (three) times daily as needed (hsv).     diltiazem (CARDIZEM CD) 240 MG 24 hr capsule Take 1 capsule (240 mg total) by mouth daily. 90 capsule 3   meloxicam (MOBIC) 15 MG tablet One tab PO qAM with a meal for 2 weeks, then daily prn pain. 30 tablet 3   Misc Natural Products (OSTEO BI-FLEX ADV DOUBLE ST) TABS Take by mouth daily.     triazolam (HALCION) 0.25 MG tablet 1-2 tabs PO 2 hours before procedure or imaging.  Do not drive with this medication. 2 tablet 0   No current facility-administered medications for this visit.     Review of Systems    She denies chest pain, dyspnea, pnd, orthopnea, n, v, dizziness, syncope, edema, weight gain, or early satiety. All other systems reviewed and are otherwise negative except as noted above.   Physical Exam    VS:  BP 112/64 (BP Location: Left Arm, Patient Position: Sitting)   Pulse 90   Wt 208 lb 9.6 oz (94.6 kg)   SpO2 98%   BMI 34.71 kg/m  GEN: Well nourished, well developed, in no acute distress. HEENT: normal. Neck: Supple, no JVD, carotid bruits, or masses. Cardiac: RRR, no murmurs, rubs, or gallops. No clubbing, cyanosis, edema.  Radials/DP/PT 2+ and equal bilaterally.  Respiratory:  Respirations regular and unlabored, clear to auscultation bilaterally. GI: Soft, nontender, nondistended, BS + x 4. MS: no deformity or atrophy. Skin: warm and dry, no rash. Neuro:  Strength and sensation are intact. Psych: Normal affect.  Accessory Clinical Findings    ECG personally reviewed by me today -NSR, 90 bpm, low voltage, LAD- no acute changes.   Lab Results  Component Value  Date   WBC 8.2 06/29/2021   HGB 14.0 06/29/2021   HCT 42.3 06/29/2021   MCV 90.6 06/29/2021   PLT 325 06/29/2021   Lab Results  Component Value Date   CREATININE 0.68 06/29/2021   BUN 9 06/29/2021   NA 141 06/29/2021   K 4.7 06/29/2021   CL 104 06/29/2021   CO2 28 06/29/2021   Lab Results  Component Value Date   ALT 25 06/29/2021   AST 14 06/29/2021   BILITOT 0.7 06/29/2021   Lab Results  Component Value Date   CHOL 192 06/29/2021   HDL 55 06/29/2021   LDLCALC 119 (H) 06/29/2021   TRIG 82 06/29/2021   CHOLHDL 3.5 06/29/2021    No results found for: "HGBA1C"  Assessment & Plan    1. Palpitations/PVCs/NSVT: EKG today shows NSR, 90 bpm, low voltage, LAFB.  Denies any significant palpitations.  Continue diltiazem.  2. Hypertension: BP well controlled. Continue current antihypertensive regimen.   3. Hyperlipidemia: LDL was 119 in 06/2021.  Monitor managed per PCP.  4. Obesity: Continue lifestyle modifications with diet and exercise.  5. Preoperative cardiac exam: According to the Revised Cardiac Risk Index (RCRI), her Perioperative Risk of Major Cardiac Event is (%): 0.4. Her Functional Capacity in METs is: 7.99 according to the Duke Activity Status Index (DASI).Therefore, based on ACC/AHA guidelines, patient would be at acceptable risk for the planned procedure without further cardiovascular testing. I will route this recommendation to the requesting party via Epic fax function.  6. Disposition: Follow-up in 1 year.     Joylene Grapes, NP 01/17/2022, 4:32 PM

## 2022-01-17 NOTE — Patient Instructions (Signed)
Medication Instructions:  Your physician recommends that you continue on your current medications as directed. Please refer to the Current Medication list given to you today.   *If you need a refill on your cardiac medications before your next appointment, please call your pharmacy*   Lab Work: NONE ordered at this time of appointment   If you have labs (blood work) drawn today and your tests are completely normal, you will receive your results only by: MyChart Message (if you have MyChart) OR A paper copy in the mail If you have any lab test that is abnormal or we need to change your treatment, we will call you to review the results.   Testing/Procedures: NONE ordered at this time of appointment     Follow-Up: At [facility], you and your health needs are our priority.  As part of our continuing mission to provide you with exceptional heart care, we have created designated Provider Care Teams.  These Care Teams include your primary Cardiologist (physician) and Advanced Practice Providers (APPs -  Physician Assistants and Nurse Practitioners) who all work together to provide you with the care you need, when you need it.  We recommend signing up for the patient portal called "MyChart".  Sign up information is provided on this After Visit Summary.  MyChart is used to connect with patients for Virtual Visits (Telemedicine).  Patients are able to view lab/test results, encounter notes, upcoming appointments, etc.  Non-urgent messages can be sent to your provider as well.   To learn more about what you can do with MyChart, go to [email or web address]://[email or web address].com.    Your next appointment:   1 year(s)  The format for your next appointment:   In Person  Provider:   Kardie Tobb, DO     Other Instructions   Important Information About Sugar       

## 2022-02-08 ENCOUNTER — Ambulatory Visit: Payer: 59 | Admitting: Cardiology

## 2022-04-11 ENCOUNTER — Other Ambulatory Visit: Payer: Self-pay | Admitting: Family Medicine

## 2022-04-12 MED ORDER — ACYCLOVIR 400 MG PO TABS: 400.0000 mg | ORAL_TABLET | Freq: Three times a day (TID) | ORAL | 3 refills | Status: DC | PRN

## 2022-05-31 ENCOUNTER — Telehealth: Payer: Self-pay | Admitting: *Deleted

## 2022-05-31 NOTE — Telephone Encounter (Signed)
   Name: Rachel Cisneros  DOB: 1970/12/19  MRN: [id number]  Primary Cardiologist: Berniece Salines, DO   Preoperative team, please contact this patient and set up a phone call appointment for further preoperative risk assessment. Please obtain consent and complete medication review. Thank you for your help.   Ledora Bottcher, PA 05/31/2022, 1:35 PM [facility]

## 2022-05-31 NOTE — Telephone Encounter (Signed)
  Patient Consent for Virtual Visit         Rachel Cisneros has provided verbal consent on 05/31/2022 for a virtual visit (video or telephone).   CONSENT FOR VIRTUAL VISIT FOR:  Rachel Cisneros  By participating in this virtual visit I agree to the following:  I hereby voluntarily request, consent and authorize [facility] and its employed or contracted physicians, physician assistants, nurse practitioners or other licensed health care professionals (the Practitioner), to provide me with telemedicine health care services (the "Services") as deemed necessary by the treating Practitioner. I acknowledge and consent to receive the Services by the Practitioner via telemedicine. I understand that the telemedicine visit will involve communicating with the Practitioner through live audiovisual communication technology and the disclosure of certain medical information by electronic transmission. I acknowledge that I have been given the opportunity to request an in-person assessment or other available alternative prior to the telemedicine visit and am voluntarily participating in the telemedicine visit.  I understand that I have the right to withhold or withdraw my consent to the use of telemedicine in the course of my care at any time, without affecting my right to future care or treatment, and that the Practitioner or I may terminate the telemedicine visit at any time. I understand that I have the right to inspect all information obtained and/or recorded in the course of the telemedicine visit and may receive copies of available information for a reasonable fee.  I understand that some of the potential risks of receiving the Services via telemedicine include:  Delay or interruption in medical evaluation due to technological equipment failure or disruption; Information transmitted may not be sufficient (e.g. poor resolution of images) to allow for appropriate medical decision making by the Practitioner;  and/or  In rare instances, security protocols could fail, causing a breach of personal health information.  Furthermore, I acknowledge that it is my responsibility to provide information about my medical history, conditions and care that is complete and accurate to the best of my ability. I acknowledge that Practitioner's advice, recommendations, and/or decision may be based on factors not within their control, such as incomplete or inaccurate data provided by me or distortions of diagnostic images or specimens that may result from electronic transmissions. I understand that the practice of medicine is not an exact science and that Practitioner makes no warranties or guarantees regarding treatment outcomes. I acknowledge that a copy of this consent can be made available to me via my patient portal ([facility]), or I can request a printed copy by calling the office of [facility].    I understand that my insurance will be billed for this visit.   I have read or had this consent read to me. I understand the contents of this consent, which adequately explains the benefits and risks of the Services being provided via telemedicine.  I have been provided ample opportunity to ask questions regarding this consent and the Services and have had my questions answered to my satisfaction. I give my informed consent for the services to be provided through the use of telemedicine in my medical care

## 2022-05-31 NOTE — Telephone Encounter (Signed)
   Pre-operative Risk Assessment    Patient Name: Rachel Cisneros  DOB: April 15, 1971 MRN: [id number]      Request for Surgical Clearance    Procedure:   Left knee Arthroscopy  Date of Surgery:  Clearance 06/19/22                                 Surgeon:  Dr. Lowella Petties Surgeon's Group or Practice Name:  [facility] Phone number:  [phone] Fax number:  [phone]   Type of Clearance Requested:   - Medical    Type of Anesthesia:  General    Additional requests/questions:    Signed, Greer Ee   05/31/2022, 11:06 AM

## 2022-06-04 NOTE — Progress Notes (Unsigned)
Virtual Visit via Telephone Note   Because of Rachel Cisneros's co-morbid illnesses, she is at least at moderate risk for complications without adequate follow up.  This format is felt to be most appropriate for this patient at this time.  The patient did not have access to video technology/had technical difficulties with video requiring transitioning to audio format only (telephone).  All issues noted in this document were discussed and addressed.  No physical exam could be performed with this format.  Please refer to the patient's chart for her consent to telehealth for [facility].  Evaluation Performed:  Preoperative cardiovascular risk assessment _____________   Date:  06/04/2022   Patient ID:  Rachel Cisneros, DOB June 21, 1970, MRN [id number] Patient Location:  Home Provider location:   Office  Primary Care Provider:  Hali Marry, MD Primary Cardiologist:  Berniece Salines, DO  Chief Complaint / Patient Profile   52 y.o. y/o female with a h/o PVCs, NSVT, hypertension, hyperlipidemia, obesity, coronary calcium score of 0 who is pending left knee arthroscopy and presents today for telephonic preoperative cardiovascular risk assessment.  History of Present Illness    Rachel Cisneros is a 52 y.o. female who presents via audio/video conferencing for a telehealth visit today.  Pt was last seen in cardiology clinic on 01/17/22 by Diona Browner, NP.  At that time JAEDON BARRERA was doing well.  The patient is now pending procedure as outlined above. Since her last visit, she denies chest pain, shortness of breath, lower extremity edema, fatigue, palpitations, melena, hematuria, hemoptysis, diaphoresis, weakness, presyncope, syncope, orthopnea, and PND. She remains physically active, walking dogs approximately 1 mile daily and can achieve > 4 METS activity without concerning cardiac symptoms.    Past Medical History    Past Medical History:  Diagnosis Date   Chronic back pain  02/02/2019   Elevated LDL cholesterol level 05/25/2019   Hiatal hernia 08/12/2019   Palpitation 02/02/2019   Palpitations    Scoliosis 02/02/2019   Skipped heart beats 02/10/2019   Stress incontinence 02/02/2019   Tachycardia    Past Surgical History:  Procedure Laterality Date   TONSILLECTOMY      Allergies  Allergies  Allergen Reactions   Codeine Shortness Of Breath and Swelling    Home Medications    Prior to Admission medications   Medication Sig Start Date End Date Taking? Authorizing Provider  acyclovir (ZOVIRAX) 400 MG tablet Take 1 tablet (400 mg total) by mouth 3 (three) times daily as needed (hsv). 04/12/22   Hali Marry, MD  diltiazem (CARDIZEM CD) 240 MG 24 hr capsule Take 1 capsule (240 mg total) by mouth daily. 01/17/22   Lenna Sciara, NP  Misc Natural Products (OSTEO BI-FLEX ADV DOUBLE ST) TABS Take by mouth daily.    [provider]  triazolam (HALCION) 0.25 MG tablet 1-2 tabs PO 2 hours before procedure or imaging.  Do not drive with this medication. 12/07/21   Silverio Decamp, MD    Physical Exam    Vital Signs:  [name] does not have vital signs available for review today.  Given telephonic nature of communication, physical exam is limited. AAOx3. NAD. Normal affect.  Speech and respirations are unlabored.  Accessory Clinical Findings    None  Assessment & Plan    1.  Preoperative Cardiovascular Risk Assessment: The patient is doing well from a cardiac perspective. Therefore, based on ACC/AHA guidelines, the patient would be at acceptable risk  for the planned procedure without further cardiovascular testing. According to the Revised Cardiac Risk Index (RCRI), her Perioperative Risk of Major Cardiac Event is (%): 0.4. Her Functional Capacity in METs is: 8.23 according to the Duke Activity Status Index (DASI).  The patient was advised that if she develops new symptoms prior to surgery to contact our office to arrange for a  follow-up visit, and she verbalized understanding.  No request to hold cardiac medications.  A copy of this note will be routed to requesting surgeon.  Time:   Today, I have spent 6 minutes with the patient with telehealth technology discussing medical history, symptoms, and management plan.    Emmaline Life, NP-C  06/05/2022, 9:36 AM [address], [address] Office [phone] Fax [phone]

## 2022-06-05 ENCOUNTER — Ambulatory Visit: Payer: 59 | Attending: Internal Medicine | Admitting: Nurse Practitioner

## 2022-06-05 ENCOUNTER — Encounter: Payer: Self-pay | Admitting: Nurse Practitioner

## 2022-06-05 DIAGNOSIS — Z01810 Encounter for preprocedural cardiovascular examination: Secondary | ICD-10-CM

## 2022-06-19 DIAGNOSIS — M94262 Chondromalacia, left knee: Secondary | ICD-10-CM | POA: Diagnosis not present

## 2022-06-19 DIAGNOSIS — S83232A Complex tear of medial meniscus, current injury, left knee, initial encounter: Secondary | ICD-10-CM | POA: Diagnosis not present

## 2022-06-19 DIAGNOSIS — M6752 Plica syndrome, left knee: Secondary | ICD-10-CM | POA: Diagnosis not present

## 2022-06-19 DIAGNOSIS — G8918 Other acute postprocedural pain: Secondary | ICD-10-CM | POA: Diagnosis not present

## 2022-06-27 DIAGNOSIS — M25662 Stiffness of left knee, not elsewhere classified: Secondary | ICD-10-CM | POA: Diagnosis not present

## 2022-06-27 DIAGNOSIS — R531 Weakness: Secondary | ICD-10-CM | POA: Diagnosis not present

## 2022-07-15 ENCOUNTER — Other Ambulatory Visit: Payer: Self-pay | Admitting: Family Medicine

## 2022-07-15 DIAGNOSIS — Z1231 Encounter for screening mammogram for malignant neoplasm of breast: Secondary | ICD-10-CM

## 2022-07-25 DIAGNOSIS — M25551 Pain in right hip: Secondary | ICD-10-CM | POA: Diagnosis not present

## 2022-07-25 DIAGNOSIS — M25662 Stiffness of left knee, not elsewhere classified: Secondary | ICD-10-CM | POA: Diagnosis not present

## 2022-07-29 ENCOUNTER — Telehealth: Payer: Self-pay | Admitting: Family Medicine

## 2022-07-29 NOTE — Telephone Encounter (Signed)
Pt was calling and wanting to know if Dr.Metheney is able to take her Aunt on as a New patient or if she would need to make a new pt appt with a provider who is taking new patients? I informed pt that I would call her back and let her know.

## 2022-07-30 NOTE — Telephone Encounter (Signed)
Ok to schedule a couple of weeks out

## 2022-08-01 NOTE — Telephone Encounter (Signed)
Sent pt a My chart note to let her know that Dr.Metheney is willing to see her aunt and she can have her to call and schedule with her.

## 2022-08-28 ENCOUNTER — Ambulatory Visit
Admission: RE | Admit: 2022-08-28 | Discharge: 2022-08-28 | Disposition: A | Discharge status: Home / Self Care | Payer: 59 | Source: Ambulatory Visit | Attending: Family Medicine | Admitting: Family Medicine

## 2022-08-28 DIAGNOSIS — Z1231 Encounter for screening mammogram for malignant neoplasm of breast: Secondary | ICD-10-CM | POA: Diagnosis not present

## 2022-08-29 NOTE — Progress Notes (Signed)
Please call patient. Normal mammogram.  Repeat in 1 year.  

## 2023-01-09 ENCOUNTER — Ambulatory Visit (INDEPENDENT_AMBULATORY_CARE_PROVIDER_SITE_OTHER): Payer: 59

## 2023-01-09 ENCOUNTER — Ambulatory Visit: Payer: 59 | Admitting: Family Medicine

## 2023-01-09 ENCOUNTER — Encounter: Payer: Self-pay | Admitting: Family Medicine

## 2023-01-09 VITALS — BP 119/70 | HR 91 | Ht 65.0 in | Wt 208.0 lb

## 2023-01-09 DIAGNOSIS — M25441 Effusion, right hand: Secondary | ICD-10-CM | POA: Diagnosis not present

## 2023-01-09 DIAGNOSIS — M79672 Pain in left foot: Secondary | ICD-10-CM

## 2023-01-09 DIAGNOSIS — Z808 Family history of malignant neoplasm of other organs or systems: Secondary | ICD-10-CM

## 2023-01-09 DIAGNOSIS — M25551 Pain in right hip: Secondary | ICD-10-CM | POA: Diagnosis not present

## 2023-01-09 DIAGNOSIS — M79641 Pain in right hand: Secondary | ICD-10-CM | POA: Diagnosis not present

## 2023-01-09 DIAGNOSIS — M25572 Pain in left ankle and joints of left foot: Secondary | ICD-10-CM | POA: Diagnosis not present

## 2023-01-09 NOTE — Patient Instructions (Signed)
Can try a supplement called Turmeric for joint inflammation.

## 2023-01-09 NOTE — Progress Notes (Signed)
Acute Office Visit  Subjective:     Patient ID: Rachel Cisneros, female    DOB: August 12, 1970, 52 y.o.   MRN: [id number]  Chief Complaint  Patient presents with   Foot Pain   Temporomandibular Joint Pain    R sided x 1 week hard to open and chew   Hip Pain    R hip pain  she reports that she has had 2 injections done in the past     HPI Patient is in today for pain on the top of her left foot.  Hx of knee surgery on the left in March.  More bothersome at rest. Not when walking or wearing a shoe.  Most pain when she steps on the floor in the AM . Does tend to go barefoot and bother her more when she does go barefoot.     Reports that her right TMJ joint has been bothering her over the last week or so as well.  She has not tried any type of anti-inflammatory but has had problems on and off for years with it.  She is also been getting some swelling over her right MCPs.  She notices it mostly after certain activities such as working out in the garden or vacuuming where she is gripping.  She is a still get a little swollen tight and sore.  She is also been having more right hip pain lately she has had 2 injections in the bursa 1 with Dr. Benjamin Stain and 1 with Dr. Luiz Blare.  She says that more recently though it has been bothering her right is actually radiating around into the right anterior hip as well as towards the buttock area.  She did have x-rays done in January 2023 and was told at that time she did have some mild arthritis.  She also wanted to let me know that her dad was diagnosed with melanoma and she would like referral to dermatology for skin check.   ROS      Objective:    BP 119/70   Pulse 91   Ht 5\' 5"  (1.651 m)   Wt 208 lb (94.3 kg)   LMP 10/31/2022 (Approximate)   SpO2 99%   BMI 34.61 kg/m    Physical Exam Vitals and nursing note reviewed.  Constitutional:      Appearance: Normal appearance.  HENT:     Head: Normocephalic and atraumatic.  Eyes:      Conjunctiva/sclera: Conjunctivae normal.  Cardiovascular:     Rate and Rhythm: Normal rate.  Pulmonary:     Effort: Pulmonary effort is normal.  Musculoskeletal:     Comments: Tender over the mid first metatarsal medially.  Also tender across the distal foot with squeeze test.  No erythema or swelling.  Dorsal pedal pulse and posterior tibial pulse 2+.  Good capillary refill.  Skin:    General: Skin is warm.  Neurological:     Mental Status: She is alert.  Psychiatric:        Mood and Affect: Mood normal.     No results found for any visits on 01/09/23.      Assessment & Plan:   Problem List Items Addressed This Visit   None Visit Diagnoses     Left foot pain    -  Primary   Relevant Orders   DG Foot Complete Left   Family history of malignant melanoma       Relevant Orders   Ambulatory referral to Dermatology   Right  hip pain       Right hand pain       Swelling of joint, hand, right           Left foot pain-since it has been going on for a couple of months at this point we did discuss getting plain films for further workup.  I do want her to start wearing more supportive shoes instead of going barefoot to see if that helps.  Could also consider referral to Dr. Dr. Benjamin Stain or podiatry for further assistance if pain is not improving I do think she would benefit from an anti-inflammatory for at least a few days to help [place] some acute relief.  Right hip pain -it sounds like it may be a little bit more than just bursitis again.  We discussed maybe getting back in with Dr. Karie Schwalbe for further evaluation she has responded well to bursa injections in the past so it may even be a combination of bursitis as well as hip arthritis.  We did discuss eating a low inflammatory diet staying away from a lot of sugars processed foods and nitrates to help just general inflammation since she has multiple areas that are impacted I do suspect it is consistent with osteoarthritis no indication  at this point to suspect rheumatoid.  But we did discuss that at some point we can always do some labs.  Swelling of the MCPs.  Most consistent with synovitis triggered by increased activity.  Recommended talk trial of topical Voltaren gel or an oral NSAID as needed.  Call if not improving.  Fam history of melanoma-will refer to dermatology for full skin check.   No orders of the defined types were placed in this encounter.   No follow-ups on file.  Nani Gasser, MD

## 2023-01-16 ENCOUNTER — Ambulatory Visit (INDEPENDENT_AMBULATORY_CARE_PROVIDER_SITE_OTHER): Payer: 59

## 2023-01-16 ENCOUNTER — Ambulatory Visit: Payer: 59 | Admitting: Sports Medicine

## 2023-01-16 DIAGNOSIS — M1611 Unilateral primary osteoarthritis, right hip: Secondary | ICD-10-CM | POA: Diagnosis not present

## 2023-01-16 DIAGNOSIS — M8580 Other specified disorders of bone density and structure, unspecified site: Secondary | ICD-10-CM | POA: Diagnosis not present

## 2023-01-16 DIAGNOSIS — M25551 Pain in right hip: Secondary | ICD-10-CM | POA: Diagnosis not present

## 2023-01-16 MED ORDER — TRAMADOL HCL 50 MG PO TABS
50.0000 mg | ORAL_TABLET | Freq: Three times a day (TID) | ORAL | 0 refills | Status: DC | PRN
Start: 2023-01-16 — End: 2023-03-06

## 2023-01-16 MED ORDER — MELOXICAM 15 MG PO TABS: ORAL_TABLET | ORAL | 3 refills | Status: DC

## 2023-01-16 NOTE — Assessment & Plan Note (Signed)
Very pleasant 52 year old female, multifactorial right hip pain, chronic. X-rays in the past did show osteoarthritis. She had a trochanteric bursa injection approximately a year and 2 to 3 months ago and did well for period of time, now she is having more pain but mostly in the groin, positive gelling. Pain is reproducible external rotation. We will start conservatively, meloxicam and tramadol at bedtime which is where she has the majority of the pain. Adding updated x-rays, home physical therapy, return to see me in 4 weeks, hip joint injection if not better.

## 2023-01-16 NOTE — Progress Notes (Signed)
    Procedures performed today:    None.  Independent interpretation of notes and tests performed by another provider:   None.  Brief History, Exam, Impression, and Recommendations:    Primary osteoarthritis of right hip Very pleasant 52 year old female, multifactorial right hip pain, chronic. X-rays in the past did show osteoarthritis. She had a trochanteric bursa injection approximately a year and 2 to 3 months ago and did well for period of time, now she is having more pain but mostly in the groin, positive gelling. Pain is reproducible external rotation. We will start conservatively, meloxicam and tramadol at bedtime which is where she has the majority of the pain. Adding updated x-rays, home physical therapy, return to see me in 4 weeks, hip joint injection if not better.  Chronic process with exacerbation and pharmacologic intervention  ____________________________________________ Ihor Austin. Benjamin Stain, M.D., ABFM., CAQSM., AME. Primary Care and Sports Medicine [facility]  Adjunct Professor of Family Medicine  [employer] [facility]  Restaurant manager, fast food

## 2023-01-27 ENCOUNTER — Encounter: Payer: Self-pay | Admitting: Sports Medicine

## 2023-01-27 ENCOUNTER — Encounter: Payer: Self-pay | Admitting: Family Medicine

## 2023-01-27 NOTE — Telephone Encounter (Signed)
Sent message in the other MyChart message.

## 2023-01-29 NOTE — Progress Notes (Signed)
Rachel Cisneros, x-ray looks okay.  No fracture or dislocation they did see some heel spurs but I know most of your pain is on the top of your foot. I know we had discussed wearing more supportive shoe wear.  If that is been helping then wonderful if not then we can either have Dr. Karie Schwalbe take a look or get you in with her podiatrist what ever you prefer.

## 2023-02-06 ENCOUNTER — Other Ambulatory Visit: Payer: Self-pay | Admitting: Nurse Practitioner

## 2023-02-13 ENCOUNTER — Ambulatory Visit (INDEPENDENT_AMBULATORY_CARE_PROVIDER_SITE_OTHER): Payer: 59

## 2023-02-13 ENCOUNTER — Ambulatory Visit: Payer: 59 | Admitting: Sports Medicine

## 2023-02-13 ENCOUNTER — Encounter: Payer: Self-pay | Admitting: Sports Medicine

## 2023-02-13 DIAGNOSIS — M1611 Unilateral primary osteoarthritis, right hip: Secondary | ICD-10-CM | POA: Diagnosis not present

## 2023-02-13 NOTE — Progress Notes (Signed)
    Procedures performed today:    Procedure: Real-time Ultrasound Guided injection of the right hip joint Device: Samsung HS60  Verbal informed consent obtained.  Time-out conducted.  Noted no overlying erythema, induration, or other signs of local infection.  Skin prepped in a sterile fashion.  Local anesthesia: Topical Ethyl chloride.  With sterile technique and under real time ultrasound guidance: Arthritic joint noted, 1 cc Kenalog 40, 2 cc lidocaine, 2 cc bupivacaine injected easily Completed without difficulty  Advised to call if fevers/chills, erythema, induration, drainage, or persistent bleeding.  Images permanently stored and available for review in PACS.  Impression: Technically successful ultrasound guided injection.  Independent interpretation of notes and tests performed by another provider:   None.  Brief History, Exam, Impression, and Recommendations:    Primary osteoarthritis of right hip Hip osteoarthritis, pain in the groin, did not respond to meloxicam, tramadol, home conditioning. Right hip joint injection today, restart home conditioning, discuss weight loss with PCP, return to see me in 6 weeks. We did discuss PRP and viscosupplementation as well as the pros and cons.    ____________________________________________ Ihor Austin. Benjamin Stain, M.D., ABFM., CAQSM., AME. Primary Care and Sports Medicine [facility]  Adjunct Professor of Family Medicine  [employer] [employer]  Restaurant manager, fast food

## 2023-02-13 NOTE — Assessment & Plan Note (Signed)
Hip osteoarthritis, pain in the groin, did not respond to meloxicam, tramadol, home conditioning. Right hip joint injection today, restart home conditioning, discuss weight loss with PCP, return to see me in 6 weeks. We did discuss PRP and viscosupplementation as well as the pros and cons.

## 2023-02-13 NOTE — Patient Instructions (Signed)

## 2023-02-14 DIAGNOSIS — M1611 Unilateral primary osteoarthritis, right hip: Secondary | ICD-10-CM | POA: Diagnosis not present

## 2023-02-14 MED ORDER — TRIAMCINOLONE ACETONIDE 40 MG/ML IJ SUSP
40.0000 mg | Freq: Once | INTRAMUSCULAR | Status: AC
Start: 2023-02-14 — End: 2023-02-14
  Administered 2023-02-14: 40 mg via INTRAMUSCULAR

## 2023-02-14 NOTE — Addendum Note (Signed)
Addended by: Carren Rang A on: 02/14/2023 01:10 PM   Modules accepted: Orders

## 2023-02-28 ENCOUNTER — Encounter: Payer: 59 | Admitting: Family Medicine

## 2023-03-06 ENCOUNTER — Encounter: Payer: Self-pay | Admitting: Family Medicine

## 2023-03-06 ENCOUNTER — Ambulatory Visit (INDEPENDENT_AMBULATORY_CARE_PROVIDER_SITE_OTHER): Payer: 59 | Admitting: Family Medicine

## 2023-03-06 ENCOUNTER — Telehealth: Payer: Self-pay | Admitting: Family Medicine

## 2023-03-06 VITALS — BP 119/72 | HR 76 | Ht 65.0 in | Wt 212.0 lb

## 2023-03-06 DIAGNOSIS — R9431 Abnormal electrocardiogram [ECG] [EKG]: Secondary | ICD-10-CM | POA: Diagnosis not present

## 2023-03-06 DIAGNOSIS — Z808 Family history of malignant neoplasm of other organs or systems: Secondary | ICD-10-CM | POA: Diagnosis not present

## 2023-03-06 DIAGNOSIS — Z8261 Family history of arthritis: Secondary | ICD-10-CM

## 2023-03-06 DIAGNOSIS — I493 Ventricular premature depolarization: Secondary | ICD-10-CM | POA: Diagnosis not present

## 2023-03-06 DIAGNOSIS — Z0000 Encounter for general adult medical examination without abnormal findings: Secondary | ICD-10-CM | POA: Diagnosis not present

## 2023-03-06 DIAGNOSIS — M2540 Effusion, unspecified joint: Secondary | ICD-10-CM | POA: Diagnosis not present

## 2023-03-06 DIAGNOSIS — M2550 Pain in unspecified joint: Secondary | ICD-10-CM

## 2023-03-06 MED ORDER — DILTIAZEM HCL ER COATED BEADS 240 MG PO CP24: 240.0000 mg | ORAL_CAPSULE | Freq: Every day | ORAL | 3 refills | Status: DC

## 2023-03-06 NOTE — Telephone Encounter (Signed)
Patient is scheduled for 12/01/23 at 3:00pm with Dr. Gala Murdoch at [facility]. Link sent to patients email address to activate mychart in order to add patient to waitlist.   Message sent to patients [facility] with appointment information.

## 2023-03-06 NOTE — Telephone Encounter (Signed)
See if she would be willing to see a different provider since they are that booked. Dr. Randye Lobo moslty focuses on hair.  She can see any of the other provider

## 2023-03-06 NOTE — Telephone Encounter (Signed)
Dermatology referral was placed in September.  Patient says she has not been contacted can you please call their office and see what might be the delay

## 2023-03-06 NOTE — Progress Notes (Addendum)
Complete physical exam  Patient: Rachel Cisneros   DOB: September 26, 1970   52 y.o. Female  MRN: [id number]  Subjective:    Chief Complaint  Patient presents with   Annual Exam    Rachel Cisneros is a 52 y.o. female who presents today for a complete physical exam. She reports consuming a general diet. The patient does not participate in regular exercise at present. She generally feels well.  She does have additional problems to discuss today.   Patient with a history advanced arthritis more than would be expected for her age seen in her left knee and her right hip.  She also gets some occasional synovitis in her hands and is worried about rheumatoid arthritis.  She does have a positive family history.  She does have some stiffness and pain in her joints in the mornings and with prolonged sitting.  Most recent fall risk assessment:    03/06/2023    9:17 AM  Fall Risk   Falls in the past year? 0  Injury with Fall? 0  Risk for fall due to : No Fall Risks  Follow up Falls evaluation completed     Most recent depression screenings:    03/06/2023    9:17 AM 01/09/2023   12:46 PM  PHQ 2/9 Scores  PHQ - 2 Score 0 0         Patient Care Team: Agapito Games, MD as PCP - General (Family Medicine) Thomasene Ripple, DO as PCP - Cardiology (Cardiology)   Outpatient Medications Prior to Visit  Medication Sig   acyclovir (ZOVIRAX) 400 MG tablet Take 1 tablet (400 mg total) by mouth 3 (three) times daily as needed (hsv).   meloxicam (MOBIC) 15 MG tablet One tab PO every 24 hours with supper for 2 weeks, then once every 24 hours prn pain.   Misc Natural Products (OSTEO BI-FLEX ADV DOUBLE ST) TABS Take by mouth daily.   [DISCONTINUED] diltiazem (CARDIZEM CD) 240 MG 24 hr capsule TAKE 1 CAPSULE BY MOUTH DAILY   [DISCONTINUED] traMADol (ULTRAM) 50 MG tablet Take 1 tablet (50 mg total) by mouth every 8 (eight) hours as needed for moderate pain.   No facility-administered medications prior to  visit.    ROS        Objective:     BP 119/72   Pulse 76   Ht 5\' 5"  (1.651 m)   Wt 212 lb (96.2 kg)   SpO2 99%   BMI 35.28 kg/m     Physical Exam Vitals and nursing note reviewed.  Constitutional:      Appearance: Normal appearance.  HENT:     Head: Normocephalic and atraumatic.  Eyes:     Conjunctiva/sclera: Conjunctivae normal.  Cardiovascular:     Rate and Rhythm: Normal rate and regular rhythm.  Pulmonary:     Effort: Pulmonary effort is normal.     Breath sounds: Normal breath sounds.  Skin:    General: Skin is warm and dry.  Neurological:     Mental Status: She is alert.  Psychiatric:        Mood and Affect: Mood normal.      No results found for any visits on 03/06/23.      Assessment & Plan:    Routine Health Maintenance and Physical Exam  Immunization History  Administered Date(s) Administered   Influenza,inj,Quad PF,6+ Mos 02/01/2019    Health Maintenance  Topic Date Due   HIV Screening  Never done   Hepatitis  C Screening  Never done   INFLUENZA VACCINE  07/14/2023 (Originally 11/14/2022)   DTaP/Tdap/Td (1 - Tdap) 03/05/2024 (Originally 05/27/1991)   Zoster Vaccines- Shingrix (1 of 2) 04/04/2024 (Originally 05/27/1991)   COVID-19 Vaccine (1) 03/21/2025 (Originally 05/26/1977)   MAMMOGRAM  08/28/2023   Fecal DNA (Cologuard)  07/03/2024   Cervical Cancer Screening (HPV/Pap Cotest)  01/10/2025   Pneumococcal Vaccine 38-31 Years old  Aged Out   HPV VACCINES  Aged Out    Discussed health benefits of physical activity, and encouraged her to engage in regular exercise appropriate for her age and condition.  Problem List Items Addressed This Visit       Cardiovascular and Mediastinum   PVC's (premature ventricular contractions)    Previously following with cardiology they typically do a yearly EKG and refill her Cardizem.  She would like me to take over that prescription for her.  EKG performed today.  Refills sent to pharmacy.       Relevant Medications   diltiazem (CARDIZEM CD) 240 MG 24 hr capsule   Other Relevant Orders   EKG 12-Lead     Other   Abnormal EKG   Other Visit Diagnoses     Routine general medical examination at a health care facility    -  Primary   Relevant Orders   CMP14+EGFR   Lipid Panel With LDL/HDL Ratio   TSH   Rheumatoid factor   CYCLIC CITRUL PEPTIDE ANTIBODY, IGG/IGA   Sedimentation rate   C-reactive protein   Uric acid   ANA   Family history of malignant melanoma       Polyarthralgia       Relevant Orders   CMP14+EGFR   Lipid Panel With LDL/HDL Ratio   TSH   Rheumatoid factor   CYCLIC CITRUL PEPTIDE ANTIBODY, IGG/IGA   Sedimentation rate   C-reactive protein   Uric acid   ANA   Family history of rheumatoid arthritis       Relevant Orders   CMP14+EGFR   Lipid Panel With LDL/HDL Ratio   TSH   Rheumatoid factor   CYCLIC CITRUL PEPTIDE ANTIBODY, IGG/IGA   Sedimentation rate   C-reactive protein   Uric acid   ANA   Joint swelling       Relevant Orders   CMP14+EGFR   Lipid Panel With LDL/HDL Ratio   TSH   Rheumatoid factor   CYCLIC CITRUL PEPTIDE ANTIBODY, IGG/IGA   Sedimentation rate   C-reactive protein   Uric acid   ANA       Keep up a regular exercise program and make sure you are eating a healthy diet Try to eat 4 servings of dairy a day, or if you are lactose intolerant take a calcium with vitamin D daily.  Your vaccines are up to date.   EKG shows NSR with rate of 70 bpm left axis deviation and low voltage QRS.  This does look unchanged from prior EKG from October 2023.   No follow-ups on file.     Nani Gasser, MD

## 2023-03-06 NOTE — Assessment & Plan Note (Signed)
Previously following with cardiology they typically do a yearly EKG and refill her Cardizem.  She would like me to take over that prescription for her.  EKG performed today.  Refills sent to pharmacy.

## 2023-03-07 NOTE — Telephone Encounter (Signed)
Referral re-routed to [facility], sent referral contact letter via mychart and Left message on [facility] voicemail requesting call back to schedule referral appointment,.

## 2023-03-07 NOTE — Telephone Encounter (Signed)
Definitely check with [facility] with Dr. Onalee Hua I think that would be great if they have something even if it is just in January or February or March

## 2023-03-07 NOTE — Telephone Encounter (Signed)
Hey Dr. Linford Arnold, I took their first available appointment with a different provider and patient will be added to waitlist. [facility] schedules are booking out in [facility] and [place].    Patient has [employer] and this insurance has limited in-network providers. We can try [facility], Dr. Langston Reusing is in network with patients insurance. Please advise.

## 2023-03-07 NOTE — Progress Notes (Signed)
Hi Rachel Cisneros, total cholesterol and LDL are elevated.  Continue to work on healthy diet and regular exercise.  Metabolic panel looks great.  Thyroid is normal.  The rheumatoid factor is negative which is great still waiting on the CCP which is a little bit more sensitive test.  Inflammatory markers are normal.  I did go ahead and do a uric acid just to test for any possibility of gout and it was normal.  Lupus test still pending.  The 10-year ASCVD risk score (Arnett DK, et al., 2019) is: 1.4%   Values used to calculate the score:     Age: 52 years     Sex: Female     Is Non-Hispanic African American: No     Diabetic: No     Tobacco smoker: No     Systolic Blood Pressure: 119 mmHg     Is BP treated: Yes     HDL Cholesterol: 70 mg/dL     Total Cholesterol: 246 mg/dL

## 2023-03-08 LAB — TSH: TSH: 1.1 u[IU]/mL (ref 0.450–4.500)

## 2023-03-08 LAB — LIPID PANEL WITH LDL/HDL RATIO
Cholesterol, Total: 246 mg/dL — ABNORMAL HIGH (ref 100–199)
HDL: 70 mg/dL (ref >39)
LDL Chol Calc (NIH): 159 mg/dL — ABNORMAL HIGH (ref 0–99)
LDL/HDL Ratio: 2.3 ratio (ref 0.0–3.2)
Triglycerides: 97 mg/dL (ref 0–149)
VLDL Cholesterol Cal: 17 mg/dL (ref 5–40)

## 2023-03-08 LAB — CMP14+EGFR
ALT: 13 [IU]/L (ref 0–32)
AST: 16 IU/L (ref 0–40)
Albumin: 4.4 g/dL (ref 3.9–4.9)
Alkaline Phosphatase: 57 [IU]/L (ref 44–121)
BUN/Creatinine Ratio: 16 (ref 9–23)
BUN: 12 mg/dL (ref 6–24)
Bilirubin Total: 0.7 mg/dL (ref 0.0–1.2)
CO2: 21 mmol/L (ref 20–29)
Calcium: 9.2 mg/dL (ref 8.7–10.2)
Chloride: 103 mmol/L (ref 96–106)
Creatinine, Ser: 0.76 mg/dL (ref 0.57–1.00)
Globulin, Total: 2.7 g/dL (ref 1.5–4.5)
Glucose: 90 mg/dL (ref 70–99)
Potassium: 4.3 mmol/L (ref 3.5–5.2)
Sodium: 138 mmol/L (ref 134–144)
Total Protein: 7.1 g/dL (ref 6.0–8.5)
eGFR: 95 mL/min/{1.73_m2} (ref >59)

## 2023-03-08 LAB — CYCLIC CITRUL PEPTIDE ANTIBODY, IGG/IGA: Cyclic Citrullin Peptide Ab: 5 U (ref 0–19)

## 2023-03-08 LAB — URIC ACID: Uric Acid: 4.2 mg/dL (ref 2.6–6.2)

## 2023-03-08 LAB — ANA: Anti Nuclear Antibody (ANA): NEGATIVE

## 2023-03-08 LAB — C-REACTIVE PROTEIN: CRP: <1 mg/L (ref 0–10)

## 2023-03-08 LAB — SEDIMENTATION RATE: Sed Rate: 2 mm/h (ref 0–40)

## 2023-03-08 LAB — RHEUMATOID FACTOR: Rheumatoid fact SerPl-aCnc: <10 [IU]/mL (ref <14.0)

## 2023-03-10 NOTE — Progress Notes (Signed)
Hi Rachel Cisneros, extra rheumatoid test came back negative which is great.  The lupus test also came back negative.

## 2023-03-21 ENCOUNTER — Ambulatory Visit: Payer: 59 | Admitting: Nurse Practitioner

## 2023-03-24 ENCOUNTER — Encounter (INDEPENDENT_AMBULATORY_CARE_PROVIDER_SITE_OTHER): Payer: Self-pay | Admitting: Sports Medicine

## 2023-03-24 DIAGNOSIS — G8929 Other chronic pain: Secondary | ICD-10-CM

## 2023-03-24 DIAGNOSIS — M549 Dorsalgia, unspecified: Secondary | ICD-10-CM

## 2023-03-24 MED ORDER — PREDNISONE 50 MG PO TABS: ORAL_TABLET | ORAL | 0 refills | Status: DC

## 2023-03-24 NOTE — Telephone Encounter (Signed)

## 2023-03-27 ENCOUNTER — Ambulatory Visit: Payer: 59 | Admitting: Sports Medicine

## 2023-03-27 ENCOUNTER — Ambulatory Visit (INDEPENDENT_AMBULATORY_CARE_PROVIDER_SITE_OTHER): Payer: 59

## 2023-03-27 DIAGNOSIS — M549 Dorsalgia, unspecified: Secondary | ICD-10-CM | POA: Diagnosis not present

## 2023-03-27 DIAGNOSIS — G8929 Other chronic pain: Secondary | ICD-10-CM

## 2023-03-27 DIAGNOSIS — M48061 Spinal stenosis, lumbar region without neurogenic claudication: Secondary | ICD-10-CM | POA: Diagnosis not present

## 2023-03-27 DIAGNOSIS — M1611 Unilateral primary osteoarthritis, right hip: Secondary | ICD-10-CM

## 2023-03-27 DIAGNOSIS — M47816 Spondylosis without myelopathy or radiculopathy, lumbar region: Secondary | ICD-10-CM | POA: Diagnosis not present

## 2023-03-27 DIAGNOSIS — M51379 Other intervertebral disc degeneration, lumbosacral region without mention of lumbar back pain or lower extremity pain: Secondary | ICD-10-CM | POA: Diagnosis not present

## 2023-03-27 MED ORDER — TRIAZOLAM 0.25 MG PO TABS: ORAL_TABLET | ORAL | 0 refills | Status: DC

## 2023-03-27 MED ORDER — NAPROXEN 500 MG PO TABS: 500.0000 mg | ORAL_TABLET | Freq: Two times a day (BID) | ORAL | 3 refills | Status: AC

## 2023-03-27 NOTE — Assessment & Plan Note (Signed)
Pleasant 22 female with chronic axial low back pain, she has seen a chiropractor and has done some home physical therapy. Unfortunately continues to have pain, more recently she did some work in the yard and had a severe flare in the pain. Mostly axial with some right sided buttock radiation. We called in prednisone as she is starting to improve to some degree. I do think that we need to go ahead and proceed with x-rays and MRI for epidural planning. She will continue with home physical therapy. Of note she did have SI joint arthritis on hip x-rays, but she has no tenderness at the SI joint today. Meloxicam ineffective, switching to naproxen, cautioned of against taking it with prednisone. She does have tramadol that she can use for breakthrough pain. Triazolam for preprocedural anxiolysis. Return to see me to go over MRI results.

## 2023-03-27 NOTE — Assessment & Plan Note (Signed)
Known right hip osteoarthritis, at the last visit she did have some pain in the groin but also laterally. We did a hip joint injection and she did not have relief, the predominant pain is mostly lateral at this point. Adding lumbar spine and trochanteric bursa conditioning, due to failure of conservative treatment including home physical therapy and injections we will proceed with MRI of the right hip without contrast.

## 2023-03-27 NOTE — Progress Notes (Signed)
    Procedures performed today:    None.  Independent interpretation of notes and tests performed by another provider:   None.  Brief History, Exam, Impression, and Recommendations:    Chronic back pain Pleasant 13 female with chronic axial low back pain, she has seen a chiropractor and has done some home physical therapy. Unfortunately continues to have pain, more recently she did some work in the yard and had a severe flare in the pain. Mostly axial with some right sided buttock radiation. We called in prednisone as she is starting to improve to some degree. I do think that we need to go ahead and proceed with x-rays and MRI for epidural planning. She will continue with home physical therapy. Of note she did have SI joint arthritis on hip x-rays, but she has no tenderness at the SI joint today. Meloxicam ineffective, switching to naproxen, cautioned of against taking it with prednisone. She does have tramadol that she can use for breakthrough pain. Triazolam for preprocedural anxiolysis. Return to see me to go over MRI results.  Primary osteoarthritis of right hip Known right hip osteoarthritis, at the last visit she did have some pain in the groin but also laterally. We did a hip joint injection and she did not have relief, the predominant pain is mostly lateral at this point. Adding lumbar spine and trochanteric bursa conditioning, due to failure of conservative treatment including home physical therapy and injections we will proceed with MRI of the right hip without contrast.    ____________________________________________ Ihor Austin. Benjamin Stain, M.D., ABFM., CAQSM., AME. Primary Care and Sports Medicine [facility]  Adjunct Professor of Family Medicine  [employer]  Restaurant manager, fast food

## 2023-04-05 ENCOUNTER — Ambulatory Visit (INDEPENDENT_AMBULATORY_CARE_PROVIDER_SITE_OTHER): Payer: 59

## 2023-04-05 DIAGNOSIS — M5136 Other intervertebral disc degeneration, lumbar region with discogenic back pain only: Secondary | ICD-10-CM | POA: Diagnosis not present

## 2023-04-05 DIAGNOSIS — M4306 Spondylolysis, lumbar region: Secondary | ICD-10-CM | POA: Diagnosis not present

## 2023-04-05 DIAGNOSIS — G8929 Other chronic pain: Secondary | ICD-10-CM

## 2023-04-05 DIAGNOSIS — M48061 Spinal stenosis, lumbar region without neurogenic claudication: Secondary | ICD-10-CM | POA: Diagnosis not present

## 2023-04-05 DIAGNOSIS — M5126 Other intervertebral disc displacement, lumbar region: Secondary | ICD-10-CM | POA: Diagnosis not present

## 2023-04-05 DIAGNOSIS — M25451 Effusion, right hip: Secondary | ICD-10-CM | POA: Diagnosis not present

## 2023-04-05 DIAGNOSIS — M24151 Other articular cartilage disorders, right hip: Secondary | ICD-10-CM | POA: Diagnosis not present

## 2023-04-05 DIAGNOSIS — M1611 Unilateral primary osteoarthritis, right hip: Secondary | ICD-10-CM | POA: Diagnosis not present

## 2023-04-05 DIAGNOSIS — M47816 Spondylosis without myelopathy or radiculopathy, lumbar region: Secondary | ICD-10-CM | POA: Diagnosis not present

## 2023-04-05 DIAGNOSIS — M25551 Pain in right hip: Secondary | ICD-10-CM | POA: Diagnosis not present

## 2023-04-05 DIAGNOSIS — M7061 Trochanteric bursitis, right hip: Secondary | ICD-10-CM | POA: Diagnosis not present

## 2023-04-14 ENCOUNTER — Encounter: Payer: Self-pay | Admitting: Sports Medicine

## 2023-04-14 NOTE — Telephone Encounter (Signed)
I called [facility] to have them read STAT.

## 2023-04-15 ENCOUNTER — Other Ambulatory Visit (INDEPENDENT_AMBULATORY_CARE_PROVIDER_SITE_OTHER): Payer: 59

## 2023-04-15 ENCOUNTER — Ambulatory Visit (INDEPENDENT_AMBULATORY_CARE_PROVIDER_SITE_OTHER): Payer: 59 | Admitting: Sports Medicine

## 2023-04-15 ENCOUNTER — Encounter: Payer: Self-pay | Admitting: Sports Medicine

## 2023-04-15 DIAGNOSIS — M1611 Unilateral primary osteoarthritis, right hip: Secondary | ICD-10-CM

## 2023-04-15 DIAGNOSIS — M47816 Spondylosis without myelopathy or radiculopathy, lumbar region: Secondary | ICD-10-CM

## 2023-04-15 MED ORDER — TRIAMCINOLONE ACETONIDE 40 MG/ML IJ SUSP
40.0000 mg | Freq: Once | INTRAMUSCULAR | Status: AC
  Administered 2023-04-15: 40 mg via INTRAMUSCULAR

## 2023-04-15 NOTE — Code Documentation (Signed)
done

## 2023-04-15 NOTE — Assessment & Plan Note (Signed)
Back pain 90% better with conservative treatment, MRI did show multilevel spondylitic changes mostly left-sided. No further intervention here.

## 2023-04-15 NOTE — Addendum Note (Signed)
 Addended by: Carren Rang A on: 04/15/2023 04:17 PM   Modules accepted: Orders

## 2023-04-15 NOTE — Assessment & Plan Note (Signed)
 Known right hip osteoarthritis, right hip joint injection did not provide much relief, ultimately MRI did show hip abductor tendinopathy and trochanteric bursitis. As pain is mostly lateral we will do a trochanteric bursa injection today and restart aggressive hip abductor strengthening. Return to see me in 6 to 8 weeks.

## 2023-04-15 NOTE — Progress Notes (Signed)
    Procedures performed today:    Procedure: Real-time Ultrasound Guided injection of the right greater trochanteric bursa Device: Samsung HS60  Verbal informed consent obtained.  Time-out conducted.  Noted no overlying erythema, induration, or other signs of local infection.  Skin prepped in a sterile fashion.  Local anesthesia: Topical Ethyl chloride.  With sterile technique and under real time ultrasound guidance: 1 cc Kenalog  40, 2 cc lidocaine, 2 cc bupivacaine injected easily Completed without difficulty  Advised to call if fevers/chills, erythema, induration, drainage, or persistent bleeding.  Images permanently stored and available for review in PACS.  Impression: Technically successful ultrasound guided injection.  Independent interpretation of notes and tests performed by another provider:   None.  Brief History, Exam, Impression, and Recommendations:    Primary osteoarthritis of right hip Known right hip osteoarthritis, right hip joint injection did not provide much relief, ultimately MRI did show hip abductor tendinopathy and trochanteric bursitis. As pain is mostly lateral we will do a trochanteric bursa injection today and restart aggressive hip abductor strengthening. Return to see me in 6 to 8 weeks.  Lumbar spondylosis Back pain 90% better with conservative treatment, MRI did show multilevel spondylitic changes mostly left-sided. No further intervention here.    ____________________________________________ Debby PARAS. Curtis, M.D., ABFM., CAQSM., AME. Primary Care and Sports Medicine [facility]  Adjunct Professor of [facility] Medicine  [employer]  Restaurant Manager, [employer]

## 2023-05-29 ENCOUNTER — Ambulatory Visit: Payer: 59 | Admitting: Sports Medicine

## 2023-06-05 ENCOUNTER — Other Ambulatory Visit: Payer: Self-pay | Admitting: Family Medicine

## 2023-07-04 IMAGING — MG DIGITAL DIAGNOSTIC UNILAT LEFT W/ CAD
3 series · 3 of 3 positions shown · non-contrast
Comparison: Previous exams including recent screening mammogram
dated 05/02/2021 diagnostic mammograms dated 10/29/2019 and
10/08/2018.

CLINICAL DATA: Patient returns today to evaluate LEFT breast
calcifications identified on recent screening mammogram.

EXAM:
DIGITAL DIAGNOSTIC UNILATERAL LEFT MAMMOGRAM WITH CAD
TECHNIQUE: Left digital diagnostic mammography was performed. Mammographic
images were processed with CAD.

[L CC]
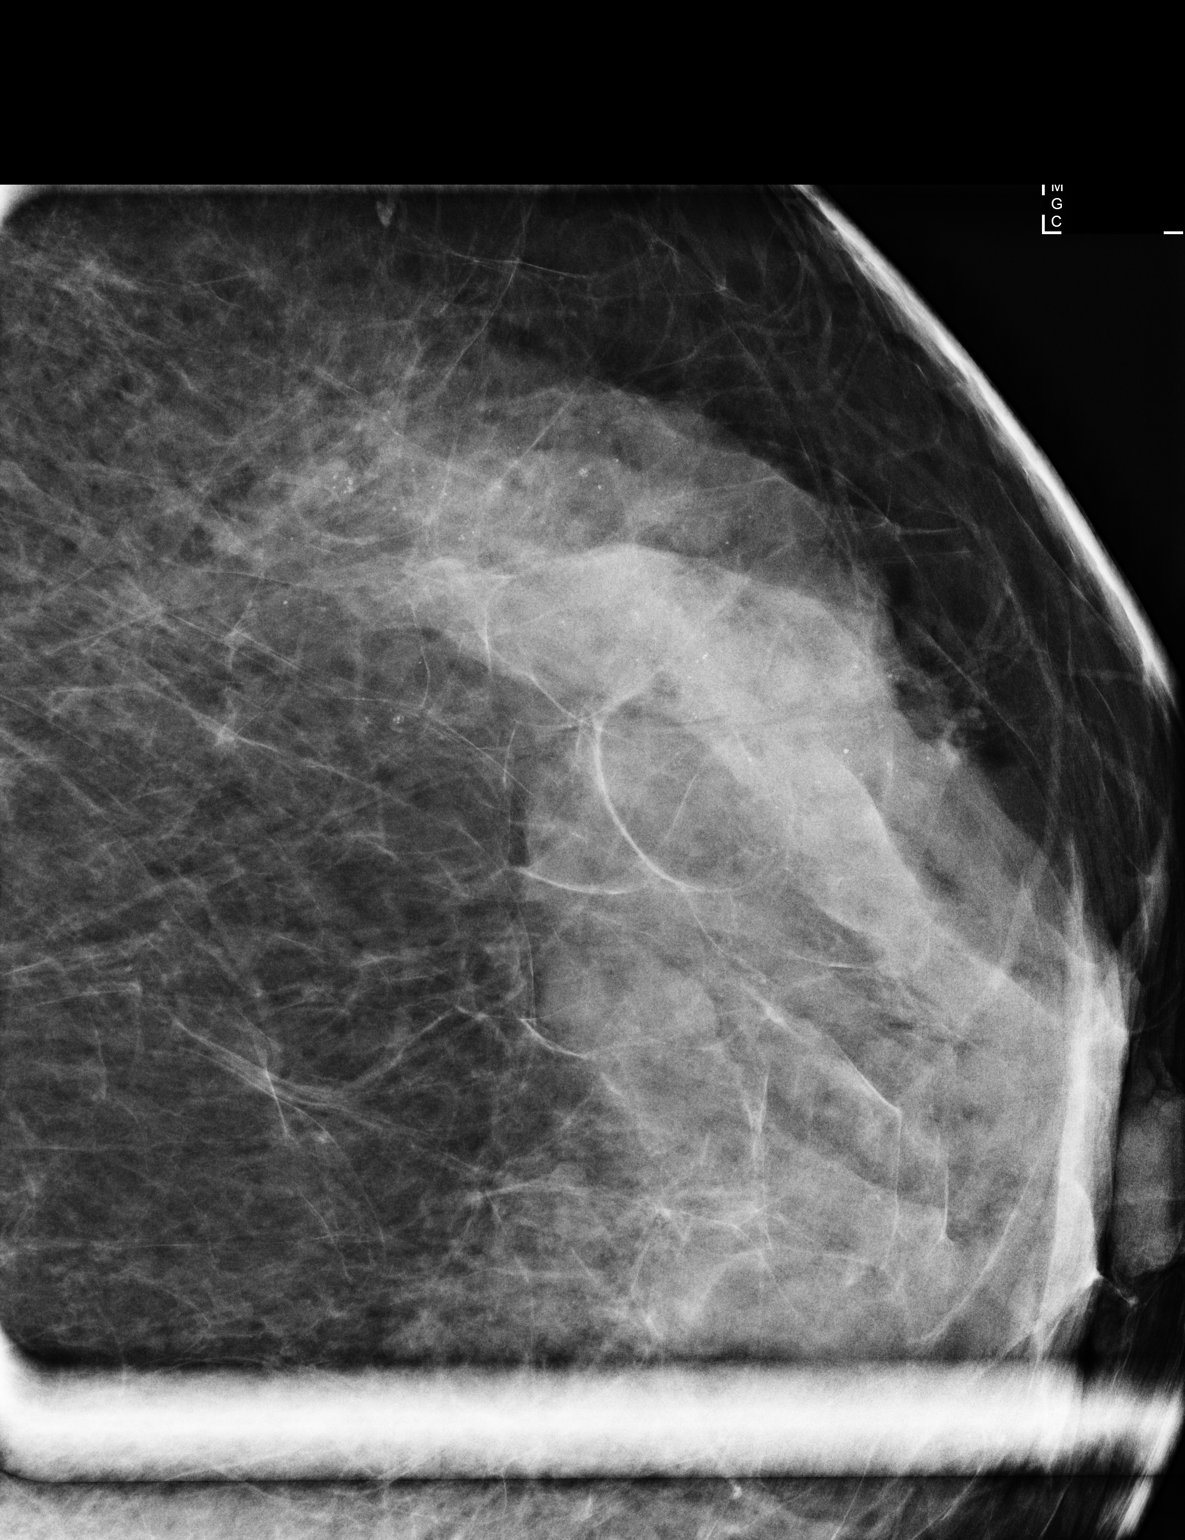

[L ML (1 of 2)]
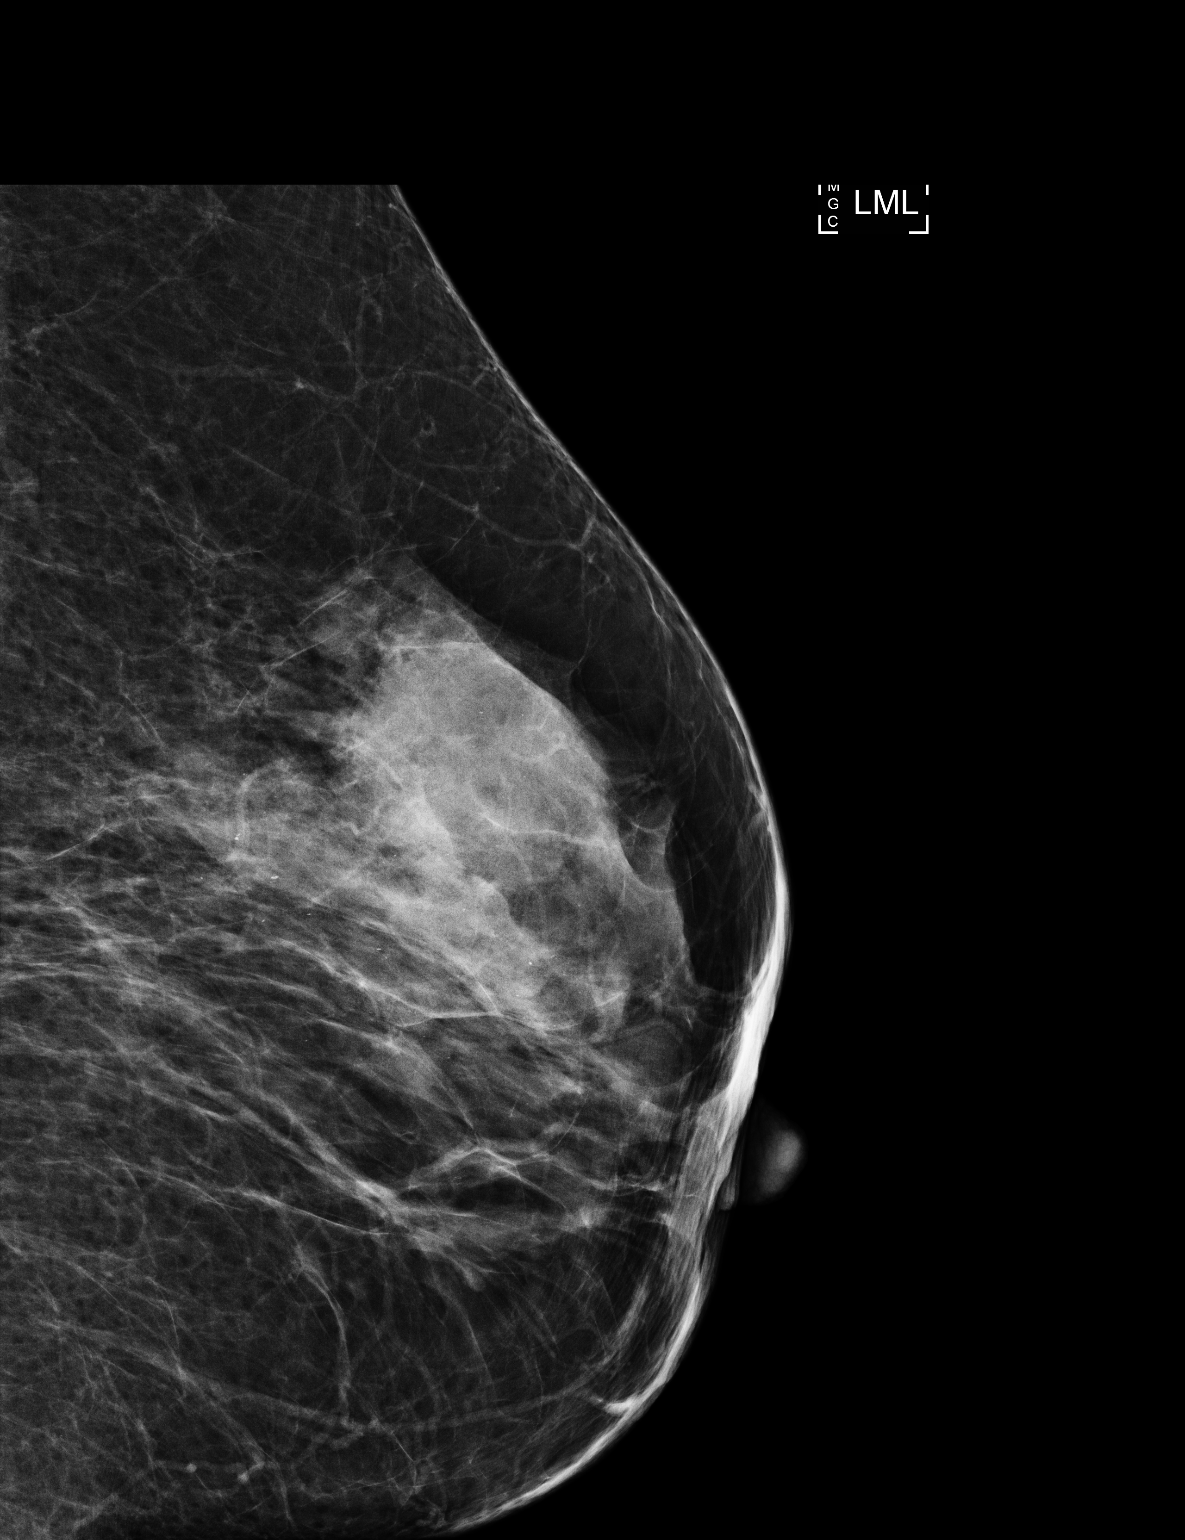

[L ML (2 of 2)]
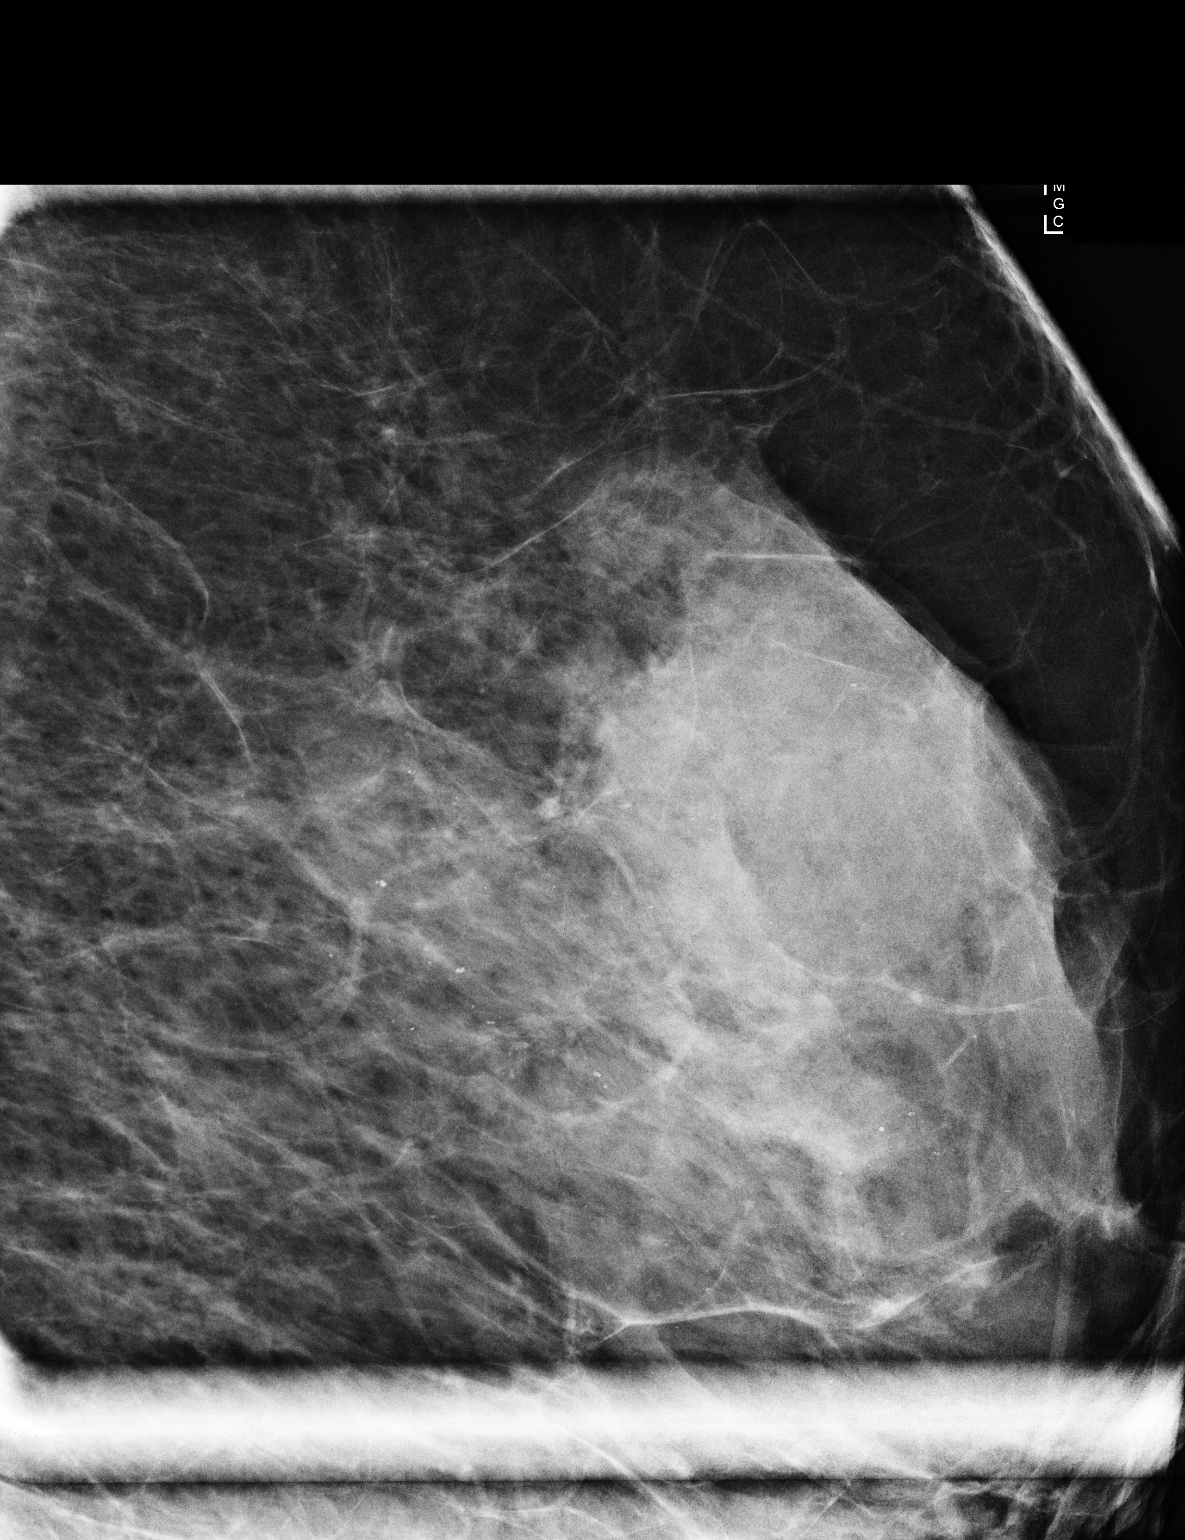

[3 of 3 positions shown; findings below may reference images not displayed]

ACR Breast Density Category c: The breast tissue is heterogeneously
dense, which may obscure small masses.
FINDINGS: On today's additional diagnostic views, including magnification
views, scattered punctate and round calcifications are confirmed
within the upper-outer quadrant of the LEFT breast, some of which
demonstrate a layering appearance indicating benign milk of
calcium/fibrocystic change. There are no suspicious pleomorphic or
fine linear branching calcifications identified.
IMPRESSION: No evidence of malignancy. Scattered benign-appearing l
calcifications within the upper-outer quadrant of the LEFT breast.

Patient may return to routine annual bilateral screening mammogram
schedule.

RECOMMENDATION:
Screening mammogram in one year.(Code:[id number])

I have discussed the findings and recommendations with the patient.
If applicable, a reminder letter will be sent to the patient
regarding the next appointment.

BI-RADS CATEGORY  2: Benign.

## 2023-08-07 ENCOUNTER — Other Ambulatory Visit: Payer: Self-pay | Admitting: Family Medicine

## 2023-08-07 DIAGNOSIS — Z1231 Encounter for screening mammogram for malignant neoplasm of breast: Secondary | ICD-10-CM

## 2023-08-22 ENCOUNTER — Encounter (HOSPITAL_COMMUNITY): Payer: Self-pay

## 2023-08-29 ENCOUNTER — Ambulatory Visit
Admission: RE | Admit: 2023-08-29 | Discharge: 2023-08-29 | Disposition: A | Discharge status: Home / Self Care | Payer: Self-pay | Source: Ambulatory Visit | Attending: Family Medicine | Admitting: Family Medicine

## 2023-08-29 DIAGNOSIS — Z1231 Encounter for screening mammogram for malignant neoplasm of breast: Secondary | ICD-10-CM

## 2023-09-03 ENCOUNTER — Ambulatory Visit: Payer: Self-pay | Admitting: Family Medicine

## 2023-09-03 NOTE — Progress Notes (Signed)
 Please call patient. Normal mammogram.  Repeat in 1 year.

## 2023-09-11 ENCOUNTER — Ambulatory Visit: Payer: 59 | Admitting: Dermatology

## 2023-09-11 ENCOUNTER — Encounter: Payer: Self-pay | Admitting: Dermatology

## 2023-09-11 VITALS — BP 115/76

## 2023-09-11 DIAGNOSIS — Z808 Family history of malignant neoplasm of other organs or systems: Secondary | ICD-10-CM

## 2023-09-11 DIAGNOSIS — L738 Other specified follicular disorders: Secondary | ICD-10-CM

## 2023-09-11 DIAGNOSIS — L578 Other skin changes due to chronic exposure to nonionizing radiation: Secondary | ICD-10-CM

## 2023-09-11 DIAGNOSIS — W908XXA Exposure to other nonionizing radiation, initial encounter: Secondary | ICD-10-CM

## 2023-09-11 DIAGNOSIS — D485 Neoplasm of uncertain behavior of skin: Secondary | ICD-10-CM

## 2023-09-11 DIAGNOSIS — Z1283 Encounter for screening for malignant neoplasm of skin: Secondary | ICD-10-CM | POA: Diagnosis not present

## 2023-09-11 DIAGNOSIS — L821 Other seborrheic keratosis: Secondary | ICD-10-CM

## 2023-09-11 DIAGNOSIS — D1801 Hemangioma of skin and subcutaneous tissue: Secondary | ICD-10-CM | POA: Diagnosis not present

## 2023-09-11 DIAGNOSIS — B079 Viral wart, unspecified: Secondary | ICD-10-CM

## 2023-09-11 DIAGNOSIS — L814 Other melanin hyperpigmentation: Secondary | ICD-10-CM

## 2023-09-11 DIAGNOSIS — D492 Neoplasm of unspecified behavior of bone, soft tissue, and skin: Secondary | ICD-10-CM | POA: Diagnosis not present

## 2023-09-11 DIAGNOSIS — D229 Melanocytic nevi, unspecified: Secondary | ICD-10-CM

## 2023-09-11 DIAGNOSIS — D2371 Other benign neoplasm of skin of right lower limb, including hip: Secondary | ICD-10-CM

## 2023-09-11 NOTE — Patient Instructions (Addendum)

## 2023-09-11 NOTE — Progress Notes (Signed)
 New Patient Visit   Subjective  Rachel Cisneros is a 53 y.o. female who presents for the following: Skin Cancer Screening and Full Body Skin Exam - Family history of Melanoma - father. No personal history of skin cancer. She uses a moisturizer with sunscreen on her face daily.  The patient presents for Total-Body Skin Exam (TBSE) for skin cancer screening and mole check. The patient has spots, moles and lesions to be evaluated, some may be new or changing and the patient may have concern these could be cancer.    The following portions of the chart were reviewed this encounter and updated as appropriate: medications, allergies, medical history  Review of Systems:  No other skin or systemic complaints except as noted in HPI or Assessment and Plan.  Objective  Well appearing patient in no apparent distress; mood and affect are within normal limits.  A full examination was performed including scalp, head, eyes, ears, nose, lips, neck, chest, axillae, abdomen, back, buttocks, bilateral upper extremities, bilateral lower extremities, hands, feet, fingers, toes, fingernails, and toenails. All findings within normal limits unless otherwise noted below.   Relevant physical exam findings are noted in the Assessment and Plan.  Right upper chest 8 x 5 mm irregular brown macule  Face (2) Filaform verrucous papule  Assessment & Plan   SKIN CANCER SCREENING PERFORMED TODAY.  ACTINIC DAMAGE - Chronic condition, secondary to cumulative UV/sun exposure - diffuse scaly erythematous macules with underlying dyspigmentation - Recommend daily broad spectrum sunscreen SPF 30+ to sun-exposed areas, reapply every 2 hours as needed.  - Staying in the shade or wearing long sleeves, sun glasses (UVA+UVB protection) and wide brim hats (4-inch brim around the entire circumference of the hat) are also recommended for sun protection.  - Call for new or changing lesions.  LENTIGINES, SEBORRHEIC KERATOSES,  HEMANGIOMAS - Benign normal skin lesions - Benign-appearing - Call for any changes  MELANOCYTIC NEVI - Tan-brown and/or pink-flesh-colored symmetric macules and papules - Benign appearing on exam today - Observation - Call clinic for new or changing moles - Recommend daily use of broad spectrum spf 30+ sunscreen to sun-exposed areas.   FAMILY HISTORY OF SKIN CANCER What type(s): Melanoma, BCC, SCC Who affected: Father  Sebaceous Hyperplasia - Small yellow papules with a central dell - Benign-appearing - Observe. Call for changes.   DERMATOFIBROMA Exam: Firm pink/brown papulenodule with dimple sign of right lower leg Treatment Plan: A dermatofibroma is a benign growth possibly related to trauma, such as an insect bite, cut from shaving, or inflamed acne-type bump.  Treatment options to remove include shave or excision with resulting scar and risk of recurrence.  Since benign-appearing and not bothersome, will observe for now.       NEOPLASM OF UNCERTAIN BEHAVIOR OF SKIN Right upper chest Epidermal / dermal shaving  Lesion diameter (cm):  0.8 Informed consent: discussed and consent obtained   Timeout: patient name, date of birth, surgical site, and procedure verified   Procedure prep:  Patient was prepped and draped in usual sterile fashion Prep type:  Isopropyl alcohol Anesthesia: the lesion was anesthetized in a standard fashion   Anesthetic:  1% lidocaine w/ epinephrine 1-100,000 buffered w/ 8.4% NaHCO3 Instrument used: flexible razor blade   Hemostasis achieved with: pressure, aluminum chloride and electrodesiccation   Outcome: patient tolerated procedure well   Post-procedure details: sterile dressing applied and wound care instructions given   Dressing type: bandage and petrolatum   Specimen 1 - Surgical pathology Differential  Diagnosis: R/O Dysplastic nevus  Check Margins: No VIRAL WARTS, UNSPECIFIED TYPE (2) Face (2) Destruction of lesion - Face  (2) Complexity: simple   Destruction method: cryotherapy   Informed consent: discussed and consent obtained   Timeout:  patient name, date of birth, surgical site, and procedure verified Lesion destroyed using liquid nitrogen: Yes   Region frozen until ice ball extended beyond lesion: Yes   Outcome: patient tolerated procedure well with no complications   Post-procedure details: wound care instructions given    Return in about 1 year (around 09/10/2024) for TBSE.  I, Eliot Guernsey, CMA, am acting as scribe for [employer], DO .   Documentation: I have reviewed the above documentation for accuracy and completeness, and I agree with the above.  Louana Roup, DO

## 2023-09-16 LAB — SURGICAL PATHOLOGY

## 2023-09-17 ENCOUNTER — Ambulatory Visit: Payer: Self-pay | Admitting: Dermatology

## 2023-12-16 ENCOUNTER — Encounter: Payer: Self-pay | Admitting: Sports Medicine

## 2024-03-03 ENCOUNTER — Other Ambulatory Visit: Payer: Self-pay | Admitting: Family Medicine

## 2024-03-03 DIAGNOSIS — I493 Ventricular premature depolarization: Secondary | ICD-10-CM

## 2024-03-30 ENCOUNTER — Encounter: Admitting: Family Medicine

## 2024-04-27 ENCOUNTER — Encounter: Payer: Self-pay | Admitting: Family Medicine

## 2024-04-27 ENCOUNTER — Ambulatory Visit (INDEPENDENT_AMBULATORY_CARE_PROVIDER_SITE_OTHER): Admitting: Family Medicine

## 2024-04-27 VITALS — BP 133/80 | HR 75 | Ht 65.0 in | Wt 223.0 lb

## 2024-04-27 DIAGNOSIS — Z0000 Encounter for general adult medical examination without abnormal findings: Secondary | ICD-10-CM

## 2024-04-27 DIAGNOSIS — Z1211 Encounter for screening for malignant neoplasm of colon: Secondary | ICD-10-CM

## 2024-04-27 NOTE — Progress Notes (Signed)
 "  Complete physical exam  Patient: Rachel Cisneros    DOB: 10/31/1970 53 y.o.   MRN: [id number]  Chief Complaint  Patient presents with   Annual Exam    Subjective:    Rachel Cisneros is a 54 y.o. female who presents today for a complete physical exam. She reports consuming a general diet. Some exercise. She generally feels well.  She does not have additional problems to discuss today.   Discussed the use of AI scribe software for clinical note transcription with the patient, who gave verbal consent to proceed.  History of Present Illness Rachel Cisneros is a 54 year old female who presents for a routine follow-up and discussion of preventive health measures.  Skin cancer screening - Recent dermatology evaluation due to paternal history of melanoma - Full skin examination performed; one biopsy taken, results negative for malignancy - Plans to continue annual skin checks  Preventive health maintenance - Received influenza vaccination - Considering blood work, including hepatitis C screening, which has not been performed previously - Contemplating updating tetanus vaccination, uncertain of last dose  Weight gain - Progressive increase in weight, predominantly in the abdominal region - Finds weight gain frustrating despite walking for exercise - Drinks large amounts of water and consumes smoothies - Has not been tracking calorie intake  Perimenopausal symptoms - Irregular menstrual cycles - Nocturnal hot sensations, especially in the core - Previous pelvic ultrasound performed for irregular periods - Previously prescribed medication to induce menstruation if needed - Hormone levels previously found to be low  Rectal bleeding - Bright red blood in stool, attributed to hemorrhoids - Completed Cologuard test three years ago - Considering full colonoscopy due to occ rectal bleeding  Medication management - Currently taking diltiazem  - Prescribed as a 90-day supply, but  pharmacy dispensing 30-day supply possibly due to insurance changes - Considering mail order pharmacy to obtain 90-day supply   Most recent fall risk assessment:    04/27/2024    9:45 AM  Fall Risk   Falls in the past year? 0  Number falls in past yr: 0  Injury with Fall? 0  Risk for fall due to : No Fall Risks  Follow up Falls evaluation completed     Most recent depression screenings:    04/27/2024    9:45 AM 03/06/2023    9:17 AM  PHQ 2/9 Scores  PHQ - 2 Score 0 0        Patient Care Team: Alvan Dorothyann BIRCH, MD as PCP - General (Family Medicine) Sheena Pugh, DO as PCP - Cardiology (Cardiology)   ROS    Objective:    BP 133/80   Pulse 75   Ht 5' 5 (1.651 m)   Wt 223 lb (101.2 kg)   SpO2 97%   BMI 37.11 kg/m     Physical Exam Constitutional:      Appearance: Normal appearance.  HENT:     Head: Normocephalic and atraumatic.     Right Ear: Tympanic membrane, ear canal and external ear normal.     Left Ear: Tympanic membrane, ear canal and external ear normal.     Nose: Nose normal.     Mouth/Throat:     Pharynx: Oropharynx is clear.  Eyes:     Extraocular Movements: Extraocular movements intact.     Conjunctiva/sclera: Conjunctivae normal.     Pupils: Pupils are equal, round, and reactive to light.  Neck:     Thyroid : No thyromegaly.  Cardiovascular:  Rate and Rhythm: Normal rate and regular rhythm.  Pulmonary:     Effort: Pulmonary effort is normal.     Breath sounds: Normal breath sounds.  Abdominal:     General: Bowel sounds are normal.     Palpations: Abdomen is soft.     Tenderness: There is no abdominal tenderness.  Musculoskeletal:        General: No swelling.     Cervical back: Neck supple.  Skin:    General: Skin is warm and dry.  Neurological:     Mental Status: She is oriented to person, place, and time.  Psychiatric:        Mood and Affect: Mood normal.        Behavior: Behavior normal.       No results found for  any visits on 04/27/24.      Assessment & Plan:    Routine Health Maintenance and Physical Exam Immunization History  Administered Date(s) Administered   Influenza,inj,Quad PF,6+ Mos 02/01/2019    Health Maintenance  Topic Date Due   HIV Screening  Never done   Hepatitis C Screening  Never done   DTaP/Tdap/Td (1 - Tdap) Never done   Hepatitis B Vaccines 19-59 Average Risk (1 of 3 - 19+ 3-dose series) Never done   Zoster Vaccines- Shingrix (1 of 2) Never done   Pneumococcal Vaccine: 50+ Years (1 of 1 - PCV) Never done   Influenza Vaccine  07/13/2024 (Originally 11/14/2023)   COVID-19 Vaccine (1) 03/21/2025 (Originally 11/23/1970)   Fecal DNA (Cologuard)  07/03/2024   Mammogram  08/28/2024   Cervical Cancer Screening (HPV/Pap Cotest)  07/07/2026   HPV VACCINES  Aged Out   Meningococcal B Vaccine  Aged Out    Discussed health benefits of physical activity, and encouraged her to engage in regular exercise appropriate for her age and condition.  Problem List Items Addressed This Visit   None Visit Diagnoses       Wellness examination    -  Primary   Relevant Orders   CMP14+EGFR   CBC with Differential/Platelet   Hepatitis C Antibody   Lipid panel     Screen for colon cancer       Relevant Orders   Ambulatory referral to Gastroenterology       Assessment and Plan Assessment & Plan Woman's Wellness Visit Emphasized preventive care and screenings. Discussed hepatitis C screening, tetanus, pneumococcal, and shingles vaccines. - Ordered blood work including hepatitis C screening. - Encouraged tetanus vaccination if not up to date. - Discussed pneumococcal (Prevnar 20) and shingles vaccines.  Perimenopausal transition with menstrual irregularity Experiencing perimenopausal symptoms with menstrual irregularity. Discussed impact on weight and metabolism. - Consider lifestyle modifications for weight management.  PVCs managed with diltiazem . Discussed pharmacy supply  issues and mail order pharmacy option. - Instructed to discuss with pharmacist about obtaining a 90-day supply of diltiazem . - Consider mail order pharmacy for 90-day supply.  Obesity Weight gain possibly related to perimenopausal transition. Discussed lifestyle modifications and potential referral to weight management program. - Encouraged use of calorie tracking apps like My Fitness Pal or Lose It. - Recommended starting with short, manageable exercise routines. - Will consider referral to weight management program if needed.    Return in about 1 year (around 04/27/2025) for Wellness Exam.    Dorothyann Byars, MD [facility] at [facility]    "

## 2024-04-28 LAB — CBC WITH DIFFERENTIAL/PLATELET
Basophils Absolute: 0.1 x10E3/uL (ref 0.0–0.2)
Basos: 1 %
EOS (ABSOLUTE): 0.3 x10E3/uL (ref 0.0–0.4)
Eos: 5 %
Hematocrit: 46.1 % (ref 34.0–46.6)
Hemoglobin: 14.9 g/dL (ref 11.1–15.9)
Immature Grans (Abs): 0 x10E3/uL (ref 0.0–0.1)
Immature Granulocytes: 0 %
Lymphocytes Absolute: 2.2 x10E3/uL (ref 0.7–3.1)
Lymphs: 43 %
MCH: 30.3 pg (ref 26.6–33.0)
MCHC: 32.3 g/dL (ref 31.5–35.7)
MCV: 94 fL (ref 79–97)
Monocytes Absolute: 0.4 x10E3/uL (ref 0.1–0.9)
Monocytes: 9 %
Neutrophils Absolute: 2.1 x10E3/uL (ref 1.4–7.0)
Neutrophils: 42 %
Platelets: 314 x10E3/uL (ref 150–450)
RBC: 4.92 x10E6/uL (ref 3.77–5.28)
RDW: 12.1 % (ref 11.7–15.4)
WBC: 5 x10E3/uL (ref 3.4–10.8)

## 2024-04-28 LAB — LIPID PANEL
Chol/HDL Ratio: 3.3 ratio (ref 0.0–4.4)
Cholesterol, Total: 236 mg/dL — ABNORMAL HIGH (ref 100–199)
HDL: 72 mg/dL
LDL Chol Calc (NIH): 151 mg/dL — ABNORMAL HIGH (ref 0–99)
Triglycerides: 75 mg/dL (ref 0–149)
VLDL Cholesterol Cal: 13 mg/dL (ref 5–40)

## 2024-04-28 LAB — CMP14+EGFR
ALT: 14 IU/L (ref 0–32)
AST: 15 IU/L (ref 0–40)
Albumin: 4.3 g/dL (ref 3.8–4.9)
Alkaline Phosphatase: 54 IU/L (ref 49–135)
BUN/Creatinine Ratio: 14 (ref 9–23)
BUN: 11 mg/dL (ref 6–24)
Bilirubin Total: 0.5 mg/dL (ref 0.0–1.2)
CO2: 21 mmol/L (ref 20–29)
Calcium: 9.2 mg/dL (ref 8.7–10.2)
Chloride: 104 mmol/L (ref 96–106)
Creatinine, Ser: 0.76 mg/dL (ref 0.57–1.00)
Globulin, Total: 2.1 g/dL (ref 1.5–4.5)
Glucose: 94 mg/dL (ref 70–99)
Potassium: 4.7 mmol/L (ref 3.5–5.2)
Sodium: 140 mmol/L (ref 134–144)
Total Protein: 6.4 g/dL (ref 6.0–8.5)
eGFR: 95 mL/min/1.73

## 2024-04-28 LAB — HEPATITIS C ANTIBODY: Hep C Virus Ab: NONREACTIVE

## 2024-04-30 ENCOUNTER — Ambulatory Visit: Payer: Self-pay | Admitting: Family Medicine

## 2024-04-30 NOTE — Progress Notes (Signed)
 Hi Francelia, total cholesterol and LDL are elevated.  Continue to work on healthy Mediterranean diet and shooting for exercise for 30 minutes 5 days a week.  Metabolic panel including liver and kidney function is stable and blood count is normal no sign of anemia.  Negative for hepatitis C.  The 10-year ASCVD risk score (Arnett DK, et al., 2019) is: 1.8%   Values used to calculate the score:     Age: 54 years     Clinically relevant sex: Female     Is Non-Hispanic African American: No     Diabetic: No     Tobacco smoker: No     Systolic Blood Pressure: 133 mmHg     Is BP treated: Yes     HDL Cholesterol: 72 mg/dL     Total Cholesterol: 236 mg/dL

## 2024-09-13 ENCOUNTER — Ambulatory Visit: Admitting: Dermatology
